# Patient Record
Sex: Female | Born: 1950 | Race: Black or African American | Hispanic: No | Smoking: Former smoker
Health system: Southern US, Community
[De-identification: ages and names within clinical notes are randomized; demographics above are authoritative.]

## PROBLEM LIST (undated history)

## (undated) DIAGNOSIS — N281 Cyst of kidney, acquired: Secondary | ICD-10-CM

## (undated) DIAGNOSIS — F419 Anxiety disorder, unspecified: Secondary | ICD-10-CM

## (undated) DIAGNOSIS — R000 Tachycardia, unspecified: Secondary | ICD-10-CM

## (undated) DIAGNOSIS — E785 Hyperlipidemia, unspecified: Secondary | ICD-10-CM

## (undated) DIAGNOSIS — D649 Anemia, unspecified: Secondary | ICD-10-CM

## (undated) DIAGNOSIS — I10 Essential (primary) hypertension: Secondary | ICD-10-CM

## (undated) DIAGNOSIS — F32A Depression, unspecified: Secondary | ICD-10-CM

## (undated) DIAGNOSIS — I499 Cardiac arrhythmia, unspecified: Secondary | ICD-10-CM

## (undated) DIAGNOSIS — Z720 Tobacco use: Secondary | ICD-10-CM

## (undated) HISTORY — PX: SHOULDER SURGERY: SHX246

## (undated) HISTORY — PX: HERNIA REPAIR: SHX51

## (undated) HISTORY — DX: Hyperlipidemia, unspecified: E78.5

## (undated) HISTORY — DX: Tobacco use: Z72.0

## (undated) HISTORY — PX: ABDOMINAL HYSTERECTOMY: SHX81

## (undated) HISTORY — PX: TONSILLECTOMY: SUR1361

---

## 2001-06-13 ENCOUNTER — Encounter: Payer: Self-pay | Admitting: Internal Medicine

## 2001-06-13 ENCOUNTER — Encounter: Admission: RE | Admit: 2001-06-13 | Discharge: 2001-06-13 | Payer: Self-pay | Admitting: Internal Medicine

## 2003-11-22 ENCOUNTER — Observation Stay (HOSPITAL_COMMUNITY): Admission: RE | Admit: 2003-11-22 | Discharge: 2003-11-22 | Payer: Self-pay | Admitting: *Deleted

## 2008-08-07 ENCOUNTER — Ambulatory Visit: Admission: RE | Admit: 2008-08-07 | Discharge: 2008-08-07 | Payer: Self-pay | Admitting: Gynecologic Oncology

## 2008-08-07 ENCOUNTER — Other Ambulatory Visit: Admission: RE | Admit: 2008-08-07 | Discharge: 2008-08-07 | Payer: Self-pay | Admitting: Gynecology

## 2008-08-08 ENCOUNTER — Encounter: Payer: Self-pay | Admitting: Gynecologic Oncology

## 2008-08-15 ENCOUNTER — Ambulatory Visit (HOSPITAL_COMMUNITY): Admission: RE | Admit: 2008-08-15 | Discharge: 2008-08-15 | Payer: Self-pay | Admitting: Urology

## 2008-08-30 ENCOUNTER — Ambulatory Visit (HOSPITAL_COMMUNITY): Admission: RE | Admit: 2008-08-30 | Discharge: 2008-08-30 | Payer: Self-pay | Admitting: Urology

## 2008-12-13 ENCOUNTER — Emergency Department (HOSPITAL_COMMUNITY): Admission: EM | Admit: 2008-12-13 | Discharge: 2008-12-13 | Payer: Self-pay | Admitting: Emergency Medicine

## 2008-12-27 ENCOUNTER — Ambulatory Visit: Admission: RE | Admit: 2008-12-27 | Discharge: 2008-12-27 | Payer: Self-pay | Admitting: Gynecologic Oncology

## 2009-01-01 ENCOUNTER — Encounter: Payer: Self-pay | Admitting: Gynecology

## 2009-01-01 ENCOUNTER — Inpatient Hospital Stay (HOSPITAL_COMMUNITY): Admission: RE | Admit: 2009-01-01 | Discharge: 2009-01-04 | Payer: Self-pay | Admitting: Obstetrics & Gynecology

## 2009-02-15 ENCOUNTER — Ambulatory Visit: Admission: RE | Admit: 2009-02-15 | Discharge: 2009-02-15 | Payer: Self-pay | Admitting: Gynecology

## 2010-08-10 ENCOUNTER — Encounter: Payer: Self-pay | Admitting: Family Medicine

## 2010-08-10 ENCOUNTER — Encounter: Payer: Self-pay | Admitting: Urology

## 2010-10-27 LAB — URINALYSIS, ROUTINE W REFLEX MICROSCOPIC
Bilirubin Urine: NEGATIVE
Glucose, UA: NEGATIVE mg/dL
Hgb urine dipstick: NEGATIVE
Ketones, ur: NEGATIVE mg/dL
Nitrite: NEGATIVE
Protein, ur: NEGATIVE mg/dL
Specific Gravity, Urine: 1.011 (ref 1.005–1.030)
Urobilinogen, UA: 0.2 mg/dL (ref 0.0–1.0)
pH: 5.5 (ref 5.0–8.0)

## 2010-10-27 LAB — COMPREHENSIVE METABOLIC PANEL
ALT: 14 U/L (ref 0–35)
Alkaline Phosphatase: 57 U/L (ref 39–117)
BUN: 8 mg/dL (ref 6–23)
CO2: 30 mEq/L (ref 19–32)
Chloride: 105 mEq/L (ref 96–112)
Glucose, Bld: 90 mg/dL (ref 70–99)
Potassium: 4.7 mEq/L (ref 3.5–5.1)
Sodium: 140 mEq/L (ref 135–145)
Total Bilirubin: 0.6 mg/dL (ref 0.3–1.2)

## 2010-10-27 LAB — TYPE AND SCREEN: Antibody Screen: NEGATIVE

## 2010-10-27 LAB — CBC
HCT: 38.8 % (ref 36.0–46.0)
HCT: 42 % (ref 36.0–46.0)
Hemoglobin: 12.6 g/dL (ref 12.0–15.0)
Hemoglobin: 13.7 g/dL (ref 12.0–15.0)
MCHC: 32.6 g/dL (ref 30.0–36.0)
MCV: 83.3 fL (ref 78.0–100.0)
Platelets: 206 10*3/uL (ref 150–400)
RBC: 4.66 MIL/uL (ref 3.87–5.11)
RBC: 5.07 MIL/uL (ref 3.87–5.11)
RDW: 13.9 % (ref 11.5–15.5)
WBC: 11.1 10*3/uL — ABNORMAL HIGH (ref 4.0–10.5)
WBC: 7.1 10*3/uL (ref 4.0–10.5)

## 2010-10-27 LAB — BASIC METABOLIC PANEL
Chloride: 107 mEq/L (ref 96–112)
Creatinine, Ser: 0.56 mg/dL (ref 0.4–1.2)
GFR calc Af Amer: >60 mL/min (ref >60)
GFR calc non Af Amer: >60 mL/min (ref >60)

## 2010-10-27 LAB — DIFFERENTIAL
Basophils Absolute: 0 10*3/uL (ref 0.0–0.1)
Basophils Relative: 1 % (ref 0–1)
Eosinophils Absolute: 0.3 10*3/uL (ref 0.0–0.7)
Neutro Abs: 3.5 10*3/uL (ref 1.7–7.7)
Neutrophils Relative %: 50 % (ref 43–77)

## 2010-10-27 LAB — CA 125: CA 125: 35.9 U/mL — ABNORMAL HIGH (ref 0.0–30.2)

## 2010-10-28 LAB — URINE MICROSCOPIC-ADD ON

## 2010-10-28 LAB — COMPREHENSIVE METABOLIC PANEL
Albumin: 4.1 g/dL (ref 3.5–5.2)
Alkaline Phosphatase: 60 U/L (ref 39–117)
BUN: 9 mg/dL (ref 6–23)
Calcium: 9.6 mg/dL (ref 8.4–10.5)
Creatinine, Ser: 0.92 mg/dL (ref 0.4–1.2)
Glucose, Bld: 132 mg/dL — ABNORMAL HIGH (ref 70–99)
Total Protein: 7.3 g/dL (ref 6.0–8.3)

## 2010-10-28 LAB — URINE CULTURE: Colony Count: >=100000

## 2010-10-28 LAB — CBC
HCT: 40.2 % (ref 36.0–46.0)
Hemoglobin: 13.1 g/dL (ref 12.0–15.0)
MCHC: 32.6 g/dL (ref 30.0–36.0)
MCV: 82.3 fL (ref 78.0–100.0)
Platelets: 245 10*3/uL (ref 150–400)
RDW: 13.8 % (ref 11.5–15.5)

## 2010-10-28 LAB — URINALYSIS, ROUTINE W REFLEX MICROSCOPIC
Glucose, UA: NEGATIVE mg/dL
Hgb urine dipstick: NEGATIVE
Protein, ur: NEGATIVE mg/dL
pH: 7.5 (ref 5.0–8.0)

## 2010-10-28 LAB — DIFFERENTIAL
Basophils Relative: 0 % (ref 0–1)
Lymphocytes Relative: 12 % (ref 12–46)
Monocytes Absolute: 0.1 10*3/uL (ref 0.1–1.0)
Monocytes Relative: 1 % — ABNORMAL LOW (ref 3–12)
Neutro Abs: 8.8 10*3/uL — ABNORMAL HIGH (ref 1.7–7.7)
Neutrophils Relative %: 86 % — ABNORMAL HIGH (ref 43–77)

## 2010-10-28 LAB — PROTIME-INR: INR: 1 (ref 0.00–1.49)

## 2010-11-03 LAB — CEA: CEA: 1.6 ng/mL (ref 0.0–5.0)

## 2010-11-03 LAB — CA 125: CA 125: 25.7 U/mL (ref 0.0–30.2)

## 2010-12-02 NOTE — Op Note (Signed)
NAME:  Beth Pineda, Beth Pineda                ACCOUNT NO.:  [id number]   MEDICAL RECORD NO.:  [id number]          PATIENT TYPE:  INP   LOCATION:  1540                         FACILITY:  [facility]   PHYSICIAN:  De Blanch, M.D.DATE OF BIRTH:  1951/04/22   DATE OF PROCEDURE:  01/01/2009  DATE OF DISCHARGE:                               OPERATIVE REPORT   PREOPERATIVE DIAGNOSIS:  Complex pelvic mass, a right renal mass.   POSTOPERATIVE DIAGNOSIS:  Right ovarian fibrothecoma, leiomyomata uteri,  enlarged kidney.   PROCEDURE:  1. Total abdominal hysterectomy.  2. Right salpingo-oophorectomy.   SURGEON:  Emmaline Kluver, MD.   ASSISTANTS:  Antionette Char, MD;  Telford Nab, RN.   ANESTHESIA:  General with orotracheal tube.   ESTIMATED BLOOD LOSS:  300 mL.   SURGICAL FINDINGS:  At the time of exploratory laparotomy, the patient  had a vascular 6-cm mass arising from the right ovary.  There were some  peritubal cysts as well.  The uterus was grossly enlarged to  approximately 14- weeks size with a  pedunculated fibroid from the  uterine fundus.  The left tube and ovary appeared normal.  Exploration  of the upper abdomen revealed a kidney on the right that was  approximately 10 x 15 cm.  The liver, diaphragm, spleen, omentum, small  and large bowel, and appendix were normal.  There was no adenopathy.   PROCEDURE:  The patient brought to the operating room and after  satisfactory attainment of general anesthesia was placed in modified  lithotomy position in the Drexel stirrups.  The patient was examined.   The anterior abdominal wall, perineum, and vagina were prepped with  Betadine, a Foley catheter was inserted, and the patient was draped.  The abdomen was entered through a low midline incision.  Peritoneal  washings were obtained.  The abdomen and pelvis were explored with the  above-noted findings.  A Bookwalter retractor was positioned, and the  small bowel packed out  the pelvis.  The right retroperitoneal space was  opened identifying the external iliac artery, internal iliac artery,  ovarian vessels, and ureter.  The ovarian vessels were skeletonized,  clamped, cut, free-tied, and suture ligated.  The uterine cornu was  cross-clamped with a Kelly clamp, and the fallopian tube and ovarian  ligament were doubly cross-clamped and then divided.  The right tube and  ovary were submitted of frozen section with the above-noted findings.  Attention was turned to the left side of the pelvis where the round  ligament was divided and the retroperitoneal space opened.  The  important anatomy was identified.   The ovarian ligament and fallopian tube were cross-clamped at the level  of the uterine cornu and divided.  The distal pedicle of the ovarian  vessels and tube were free-tied with 2-0 Vicryl and then suture ligated,  thus preserving the left tube and ovary.   The peritoneum along the bladder was incised and the bladder flap  developed.  There were some very large veins in the paracervical area,  which bled.  These were clamped and suture ligated.  The uterine vessels  were clamped, divided, and suture ligated.  A supracervical hysterectomy  was then performed in order to get better exposure of the cervix.  Once  the uterus was removed from the operative field, the cervix was removed  with clamps along the cardinal ligament, paracervical tissues, and then  vaginal angles.  Vaginal angles were incised at the junction between the  cervix and the vagina.  Vaginal angles were transfixed with 0 Vicryl,  the central portion of the vagina closed with interrupted figure-of-  eight sutures of 0 Vicryl.  The pelvis was irrigated and inspected and  found to be hemostatic.   The packs and retractors were removed.  The anterior abdominal wall was  closed in layers, the first being a running mass closure using #1 PDS.  The subcutaneous tissue was irrigated and  hemostasis achieved with  cautery, the skin was closed with skin staples, a dressing was applied,  the patient was awakened from anesthesia and taken to the recovery room  in satisfactory condition.  Sponge, needle, and instrument counts  correct x2.      De Blanch, M.D.  Electronically Signed     DC/MEDQ  D:  01/01/2009  T:  01/01/2009  Job:  [id number]   cc:   Telford Nab, R.N.  [address]  [place], [place] [address]   Lindaann Slough, M.D.  Fax: [phone]   Tamika J. Lazarus Salines, M.D.  Fax: [phone]

## 2010-12-02 NOTE — Consult Note (Signed)
NAME:  Beth Pineda, Beth Pineda                ACCOUNT NO.:  [id number]   MEDICAL RECORD NO.:  [id number]          PATIENT TYPE:  OUT   LOCATION:  GYN                          FACILITY:  [facility]   PHYSICIAN:  Laurette Schimke, MD     DATE OF BIRTH:  March 28, 1951   DATE OF CONSULTATION:  DATE OF DISCHARGE:  12/27/2008                                 CONSULTATION   CHIEF COMPLAINT:  Severe pelvic pain and pelvic mass.   HISTORY OF PRESENT ILLNESS:  This is a 60 year old referred by Dr. Lazarus Salines  to the GYN oncology service on August 07, 2008.  She initially  presented to Dr. Raye Sorrow office with complaints of intermittent  constipation.  A flat plate and upright films were suspicious for a  mass.  A CT of the abdomen and pelvis were obtained, which showed a 2 cm  cystic mass in the left lobe of the liver, multiple calcified and  noncalcified stones in the gallbladder.  A large septated cystic mass in  the mid upper portion of the right kidney, resulting in compression of  the pelvic at the right kidney with mild-to-moderate hydronephrosis, and  an 11 cm right adnexal mass.  At that time, the recommendations made to  Beth Pineda were that perhaps a joint procedure could be performed  laparoscopically, at which time, both the ovary, the kidney, and perhaps  the gallbladder could be attended to.  [name] felt that since she was  asymptomatic, intervention likely was not required, and thought that she  would pursue other diagnostic testing and other consultations.  She  states that she has had a chest x-ray, it is unclear where this was  obtained, which was negative, which per her evaluation, indicated that  she was doing well.  She also said that she had unspecified blood tests  that were normal.  She sought a second opinion from a urologist at [facility] in April.  The report is not available.  Ms. Graff did not  provide [place] with a description of what the recommendation was from that  physician.  What  prompted this visit to the office today is the fact  that she noted right lower quadrant that is constant, varying between 3  and 8, associated with nausea.  She was seen in the emergency room for  this pain on Dec 13, 2008.  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  No adenopathy or ascites was noted.  The left  kidney was noted to be normal.  Gallstones were again appreciated.  The  right adnexa was noted, measuring 7 x 6.7 cm.  On that scan, it was  noted to be inseparable from a more solid-appearing structure in the  adnexa.  For this severe pain, Beth Pineda presents.  She reports low  energy since May, 2010, poor appetite, early satiety, constipation, so  to the point where she has limited herself to a liquid diet, and she  states that her bowel movements are  now less frequent, smaller, and  harder.   PAST MEDICAL HISTORY:  Notable for prior morbid obesity, which she  states resolved with a low carb diet.  Cholelithiasis.   PAST SURGICAL HISTORY:  Cesarean section x2.  Right inguinal hernia  repair for an incarcerated loop of bowel in 2005.   PAST GYN HISTORY:  Gravida 4, para 2.  Menopause at age 58.  Last Pap 1  year ago.  No history of abnormal Pap test screening.  No prior  colonoscopy.  No recent mammogram, despite recommendations at the last  visit.   REVIEW OF SYSTEMS:  A 10-point review of systems was performed, and  notable findings are as presented in the history of present illness.   PHYSICAL EXAMINATION:  Weight 141 pounds.  Blood pressure 122/82.  CHEST:  Clear to auscultation.  LYMPH NODE:  No cervical, supraclavicular, or inguinal adenopathy.  ABDOMEN:  Soft.  There is right lower quadrant tenderness.  There is no  palpable ascites or omental cake.  BACK:  No CVA tenderness.  PELVIC:  Normal external genitalia, Bartholin, urethra, and Skene  glands.  A smooth, fixed  pelvic mass is appreciated in the right lower  quadrant.  There is no nodularity noted in the cul de sac.  RECTAL:  Good tone without any masses or nodularity to the cul de sac.   IMPRESSION:  Pelvic mass:  Patient has a mass that has solid components.  The plan was for simultaneous surgical management of both the right  adnexal mass and the right nephrectomy.  An attempt was made to contact  urologist to identify whether there was a possibility of this procedure  being done in the near time.  That does not appear to be possible.  As  such, we will proceed with management of the adnexal mass.  This will be  done through the cesarean section incision, and [name] has requested  at this time that it be limited to only affected structures, namely the  right ovary.  In the event that malignancy is identified, she is aware  that the plan would be for BSO, hysterectomy, debulking, and staging.  She is aware that the surgical procedure, there will not be the  possibility of managing the right kidney mass, and it does not appear  that she wishes to attend to the right kidney mass in any urgent  fashion.   The risks of the procedure were discussed with the patient and her son,  which include infection, bleeding, damage to surrounding structures,  prolonged hospitalization, and reoperation.  She understands.   She will continue ciprofloxacin, administered in the ER.  In addition, a  prescription for Vicodin was given.  She is aware that Dr. Katheren Shams-  Sharol Given will perform this surgery next Tuesday.      Laurette Schimke, MD  Electronically Signed     WB/MEDQ  D:  12/27/2008  T:  12/28/2008  Job:  [id number]   cc:   Tamika J. Lazarus Salines, M.D.  Fax: [phone]   Lindaann Slough, M.D.  Fax: [phone]   Dorian Pod, ACNP

## 2010-12-02 NOTE — Consult Note (Signed)
NAME:  Beth Pineda, Beth Pineda NO.:  [id number]   MEDICAL RECORD NO.:  [id number]          PATIENT TYPE:  EMS   LOCATION:  ED                           FACILITY:  [facility]   PHYSICIAN:  Ardeth Sportsman, MD     DATE OF BIRTH:  [birth year]   DATE OF CONSULTATION:  12/13/2008  DATE OF DISCHARGE:                                 CONSULTATION   PRIMARY CARE PHYSICIAN:  Dorian Pod, ACNP.   GYNECOLOGIST:  Laurette Schimke, MD.   PRIMARY UROLOGIST:  Lindaann Slough, M.D.   SURGEON:  Ardeth Sportsman, MD.   REQUESTING PHYSICIAN:  Jerelyn Scott, MD. and Jodean Lima, PA-C, [facility].   REASON FOR CONSULTATION:  Nausea, vomiting and right groin/suprapubic  pain.   HISTORY OF PRESENT ILLNESS:  Ms. Crumm is a 60 year old female with  numerous issues.  She has known complex right ovarian cystic mass that  Dr. Laurette Schimke has evaluated and is considering resection along with  probably abdominal hysterectomy and right salpingo-oophorectomy (the  patient wished to preserve her left ovary).  She was also noted to have  a giant right renal mass that seems to be more consistent with a  nephroma.  I believe she has had a consult with Dr. Ezzie Dural of  [facility] for consideration of resection and got a second opinion  at [facility].  She tells me that [facility] saw her last  month and recommended observation only, not a nephrectomy.   She came in the ER after having severe right groin pain that woke her up  at 2 in the morning.  It reminded her of her incident of having an  incarcerated right inguinal hernia that my partner, Dr. Baruch Merl  had to do emergency surgery on back in 2005.  She had a couple episodes  of emesis that very dark brown according to her sons.  She thought they  were coffee grounds and had a funny taste to it, but she did not say it  tasted like frank blood.  She denies a history of any ulcers.  She has  some mild  reflux for which she takes Tums normally.  She has known  gallstones, but has never had any symptoms of biliary colic and claims  she can eat wants without any difficulty.  She normally has daily bowel  movements.  She has one sister with irritable bowel syndrome, but no  other family history of any GI problems.  She had a large bowel movement  a few hours ago that was normal that was without any hematemesis,  hematochezia or melena within it.  She denies any sick contacts or  travel history.  No change in her diet.  No history of diverticulosis.  She has never had a colonoscopy.   She has been given a few doses of narcotics.  She had a CAT scan which  noted the findings above.  The appendix could not be easily seen day,  but the radiologist could see retroperitoneal fat around the area, did  not see any  stranding around there and therefore the suspicion for  appendicitis was low.  However because of severe abdominal pain on the  right lower side, the ER physician's requested a general surgical  evaluation to make sure there is not a surgical etiology for her  problems.   The patient notes she took some charcoal tabs  that she said are sort of  an anti-gas tabs and did not have improvement.  She notes that her pain  is down, but she has had a few narcotic doses.  The most recent dose was  over 2 hours ago, morphine 2 mg.  She does have history of urinary tract  infections, but cannot recall any GI symptoms with this.  She denies any  dysuria, hematuria, pyuria.  No vaginal bleeding or discharge.  No  menorrhagia.   PAST MEDICAL HISTORY:  1. Complex right ovarian cyst.  2. Large right kidney mass replacing most of the right kidney most      likely nephroma versus a multicystic BSO carcinoma.  [facility] felt this is a benign lesion according to      the patient.  3. Hypertension.  4. Incarcerated inguinal hernia status post emergent reduction and      repair  in 2005 by Dr. Colin Benton.  5. Question of gastroesophageal reflux disease.  6. Known gallstones, asymptomatic.  7. Mild biliary and pancreatic duct dilatation, stable for several      months without any change.   PAST SURGICAL HISTORY:  1. She has had C. sections x2.  2. She had an open right inguinal repair with mesh in 2005.   MEDICATIONS:  1. Lisinopril.  2. Hydrochlorothiazide.  3. Aspirin.   ALLERGIES:  MACROBID.   SOCIAL HISTORY:  She is here today with her 2 sons.  She has a boyfriend  who she lives with.  She denies any significant alcohol drinkage,  although she does confess she had a little alcohol last night.  No  tobacco or other drug use.   FAMILY HISTORY:  She has a sister with irritable bowel syndrome, but no  history of any GI problems or  genitourinary problems that she can  recall.  No history of cancers, colon polyps, irritable bowel syndrome  or inflammatory bowel disease.   REVIEW OF SYSTEMS:  Notes per HPI.  GENERAL:  No fevers, chills, sweats.  No change in her weight.  Eyes, HEENT, cardiac, respiratory otherwise  negative.  ABDOMEN:  Noted per HPI.  No dysphagia to solid or liquids.  GU/GYN as noted in the HPI.  Heme, lymph, allergic, dermatologic are  negative.  PSYCHIATRIC:  Negative as well.   PHYSICAL EXAMINATION:  VITAL SIGNS:  Temperature is 98.3, blood pressure  is 158-150/60-90s, pulses are 90, respirations to me were 16.  Initial  pain was 10/10, most recently it was about 03/10 pain, 100% sats on room  air.  GENERAL:  She is well-developed, well-nourished, slightly overweight  female lying in bed tired, but not frankly toxic.  PSYCH:  She seems to have at least average intelligence and pretty good  insight.  No evidence of any dementia, psychosis, paranoia.  HEENT:  Eyes:  Pupils are equal, round, and reactive to light.  Extraocular movements are intact.  Sclerae nonicteric or injected.  She  is normocephalic.  Mucous membranes are dry, but  nasopharynx and  oropharynx are clear.  Dentition seems fair.  NECK:  Supple.  No masses.  Trachea  is midline.  HEART:  Regular rate and rhythm.  No murmurs, clicks or rubs.  CHEST:  Clear to auscultation bilaterally, anteriorly, posteriorly.  There is no pain to rib or sternal compression.  BACK:  No pain on cervical, thoracolumbosacral spine palpation.  No pain  in the costophrenic angles.  ABDOMEN:  Soft, slightly overweight weight, but nondistended.  She has  no umbilical hernia.  She has a low midline incision that is well-healed  with no inguinal hernias.  She is nontender in her upper abdomen or her  left side.  No Murphy's sign.  GENITAL:  Normal external female genitalia.  I detect no inguinal  hernias.  She does have discomfort in her right suprapubic region that  seems kind of close more down in the inguinal region.  It is inferior to  McBurney's point.  She does not really have any guarding was.  She  tolerated cough and bed shake without any difficulty.  RECTAL:  Refused.  EXTREMITIES:  No clubbing, cyanosis or edema.  MUSCULOSKELETAL:  Normal range of motion of the shoulders, elbows,  wrists as well as hips, knees, ankles.  LYMPH:  No head, neck axillary, groin lymphadenopathy.  SKIN:  No spider angiomas.  No telangiectasias.  No other sores or  lesions.   LABORATORY DATA:  White count is normal at 10.2, hemoglobin of 13.1.  She has a slight left shift.  Electrolytes are normal.  LFTs are normal.  Urine shows positive nitrates and large leukocyte esterase with 21-50  white cells.   DIAGNOSTICS:  CT scan I reviewed with my partner, Dr. Lina Sar as  well as with radiology.  She has some mild biliary dilatation,  intrahepatic as well as pancreatic dilatation.  No gallstones.  Compared  to her MRI that I reviewed with radiology in January this is unchanged.  There is no evidence of any cholecystitis.  No pericholecystic fluid or  gallbladder wall thickening.  There  is no discrete pancreatic mass and  some angulation of the head of the pancreas.  This is based on MRI and  on CT.  A small bowel that shows no evidence of any bowel obstruction.  There is no transition point.  Her colon has some stool, but not a large  volume.  Radiology cannot definitely see the appendix.  Dr. Dwain Sarna  and I think we see a possible candidate for a retrocecal appendix that  is very short.  There is a good rim of fat around the cecum and terminal  ileum.  There is no evidence of any stranding there at all.  She has no  inguinal herniations and no incisional herniations.   She has a large right adnexal mass as has been described before that  seems about 9-10 cm in size mainly cystic and actually pushes up close  to the umbilicus and more solid component in the right lower side.  The  top part of the cyst seen on the MRI, but a full MRI of the pelvis has  not been done that we aware of.  She has a large fibroid uterus and  calcifications in it.  Her bladder is actually pushed over so that only  the right suprapubic area is noted.  It is asymmetrical and slightly  pushing the uterus.   ASSESSMENT/PLAN:  A 60 year old female with numerous health issues and  with recent right suprapubic pain, nausea and vomiting of uncertain  etiology.  I do not think that there is a surgical  issue at this time.  There is no evidence of appendicitis by the fact of lack of stranding.  She does not have any peritonitis to my examination and her white count  is normal.   I think more likely the etiology is an urinary tract infection based on  the very positive UTI and the fact the uterus pushes the bladder over to  the right side which may be giving her more right-sided suprapubic pain.  I would treat that first and regroup.  I would consider her following  with Dr. Nelly Rout or GYN to evaluate to see if anything needs to be done  about the right ovarian mass now versus an elective resection.   It is  probably a stable issue, but it would probably be good to get there and  at least talk to them and see if they have any other insights.  Screening colonoscopy since she is over 50.  If she has repeated emesis,  I would get a Gastroccult and if that is positive, consider an EGD to  rule out some other issue, although that would not explain her right  lower quadrant pain.  No evidence of inguinal hernia at this time, so I  do feel further evaluation done.   I discussed the case with Dr. Dwain Sarna.  I discussed the case with Dr.  Jerelyn Scott and Jodean Lima as well with the emergency department.  Discussed with the patient and the son.  At this point, I do not think  there is anything I can offer surgically.  Hopefully treating the UTI  will make her more comfortable.  If she is admitted and there are  concerns, I would be happy to reconsult at this time.      Ardeth Sportsman, MD  Electronically Signed     SCG/MEDQ  D:  12/13/2008  T:  12/13/2008  Job:  [id number]

## 2010-12-02 NOTE — Consult Note (Signed)
NAME:  Beth Pineda, Beth Pineda                ACCOUNT NO.:  [id number]   MEDICAL RECORD NO.:  [id number]          PATIENT TYPE:  OUT   LOCATION:  GYN                          FACILITY:  [facility]   PHYSICIAN:  Laurette Schimke, MD     DATE OF BIRTH:  [birth year]   DATE OF CONSULTATION:  08/07/2008  DATE OF DISCHARGE:                                 CONSULTATION   REFERRING PHYSICIAN:  Dr. Lazarus Salines   CHIEF COMPLAINT:  Pelvic mass.   HISTORY OF PRESENT ILLNESS:  This is a 60 year old who was in her usual  state of good health who presented to Dr. Raye Sorrow office with complaints  of intermittent constipation with no change in her diet.  A flat plate  and upright was suspicious for a mass.  She subsequently had a CT scan  of the abdomen and  pelvis which demonstrated a 2 cm cystic mass in the  left lobe of the liver and multiple calcified and noncalcified stones in  the gallbladder with a 1.6 cm stone in the neck of the gallbladder with  the gallbladder filled with sludge.  Of note, there was a large septated  cystic mass in the upper mid portion of the right kidney resulting in  compression of the pelvis of the right kidney and mild to moderate  hydronephrosis.  There was also an 11 cm right adnexal mass.  The uterus  was enlarged with multiple fibroids and calcifications.  There was no  ascites and no evidence of retroperitoneal adenopathy.  Beth Pineda denies  any unintended weight loss.  No abdominal distention or change in her  bowel habits.   PAST MEDICAL HISTORY:  Notable for prior morbid obesity which resolved  with a low carb diet and cholelithiasis.   PAST SURGICAL HISTORY:  Cesarean section times two and a right inguinal  hernia repair of an incarcerated loop of bowel in 2005.   PAST GYN HISTORY:  Gravida 4, para 2.  Menopause at age 62.  The patient  states that she has a very good sexual drive.  Last Pap was greater than  one year ago.  She denies any history of abnormal Pap test.   SCREENING:   No prior history of colonoscopy.  Last mammogram in 2002.   SOCIAL HISTORY:  Her children are alive and well.  She reports six  cigarettes a day now, down from one pack per day for approximately 40  years.  She drinks approximately two glasses of alcohol per week.  She  denies IV drug abuse.   REVIEW OF SYSTEMS:  A 10 point review of systems negative.   PHYSICAL EXAMINATION:  A well developed female in no acute distress.  Weight 140 pounds.  Blood pressure 122/86, pulse 82, respiratory rate  18.  CHEST:  Clear to auscultation.  LYMPH NODE SURVEY:  No axillary, cervical or inguinal adenopathy.  ABDOMEN:  Soft, nontender.  A right pelvic mass is palpable.  No  ascites.  No palpable omental cake.  BACK:  No CVA tenderness.  PELVIC:  Normal external genitalia, Bartholin's, urethra, and Skene's.  The  pelvic mass is palpated, smooth.  There is no nodularity in the cul  de sac.  RECTAL:  No nodularity within the rectovaginal septum or within the cul  de sac.   IMPRESSION:  Pelvic mass.  This is an 11 cm complex solid and cystic  mass measuring approximately 11 cm in maximal dimension.  A CA 125 and a  CEA have been ordered.  Beth Pineda is very determined in her wish to  maintain her uterus and contralateral ovary if they are entirely normal.  She also strongly desires that the procedure be performed  laparoscopically.  Given the other issues involving this patient's care,  namely the right renal mass and the nephrolithiasis, if it is possible  for these procedures to be done laparoscopically at one sitting, then we  certainly will attempt to perform this.  She is aware that with two  prior cesarean sections, there may be adhesions which preclude this  procedure being adequately performed.  She was advised regarding  recommendation for removal of the contralateral ovary but wishes to  maintain this ovary.  We will contact Dr. Madilyn Hook office to identify  their plan for management of this  renal mass and will schedule the  patient accordingly.  She is aware that if there is evidence of  malignancy on pathology, a hysterectomy, bilateral salpingo-  oophorectomy, and lymph node dissection will be performed and she  concurs with this plan.      Laurette Schimke, MD  Electronically Signed     WB/MEDQ  D:  08/07/2008  T:  08/07/2008  Job:  [id number]   cc:   Telford Nab, R.N.  [address]  [place], [place] [address]   Thomos Lemons. Lazarus Salines, M.D.  Fax: [phone]   Lindaann Slough, M.D.  Fax: [phone]

## 2010-12-05 NOTE — Consult Note (Signed)
NAME:  Beth Pineda, Beth Pineda                ACCOUNT NO.:  [id number]   MEDICAL RECORD NO.:  [id number]          PATIENT TYPE:  OUT   LOCATION:  GYN                          FACILITY:  [facility]   PHYSICIAN:  De Blanch, M.D.DATE OF BIRTH:  August 02, 1950   DATE OF CONSULTATION:  DATE OF DISCHARGE:  02/15/2009                                 CONSULTATION   CHIEF COMPLAINT:  Postoperative follow-up.   INTERVAL HISTORY:  The patient is here today for first postoperative  follow-up after having undergone a total abdominal hysterectomy and  right salpingo-oophorectomy on January 01, 2009.  She had a pelvic mass  which on final pathology showed that a benign ovarian fibroma and serous  cystadenoma.  She also had some uterine fibroids.  She has had an  uncomplicated postoperative course.  She feels that she has returned to  nearly full levels of activity and denies any abdominal or pelvic pain,  has no bleeding.   PHYSICAL EXAM:  VITAL SIGNS:  Weight 127 pounds, blood pressure 120/78.  ABDOMEN:  Soft and nontender.  The midline incision is healing well.  PELVIC:  Bimanual pelvic exam EG, BUS vagina, urethra are normal.  Vaginal cuff is healing well.  No lesions are noted.  Bimanual exam  reveals postoperative induration without any masses, nodularity, or  tenderness.   IMPRESSION:  Good postoperative recovery.  The patient was given the  okay to return to full levels of activity.   Her pathology is reviewed, and she is reassured she does not have any  evidence of malignancy. She will return to the care of her primary  physicians for continuing medical care.      De Blanch, M.D.  Electronically Signed     DC/MEDQ  D:  02/22/2009  T:  02/22/2009  Job:  [id number]   cc:   Telford Nab, R.N.  [address]  [place], [place] [address]   Lindaann Slough, M.D.  Fax: [phone]   Tamika J. Lazarus Salines, M.D.  Fax: [phone]

## 2010-12-05 NOTE — Discharge Summary (Signed)
NAME:  Beth Pineda, Beth Pineda                ACCOUNT NO.:  [id number]   MEDICAL RECORD NO.:  [id number]          PATIENT TYPE:  INP   LOCATION:  1540                         FACILITY:  [facility]   PHYSICIAN:  Roseanna Rainbow, M.D.DATE OF BIRTH:  21-May-1951   DATE OF ADMISSION:  01/01/2009  DATE OF DISCHARGE:  01/04/2009                               DISCHARGE SUMMARY   CHIEF COMPLAINTS:  The patient is a 60 year old with a pelvic mass and  associated pain who presents for operative management.  Please see the  dictated history and physical as per Dr. Laurette Schimke.   HOSPITAL COURSE:  The patient was admitted and underwent a total  abdominal hysterectomy and right salpingo-oophorectomy.  Please see the  dictated operative summary.  On postoperative day #1 a basic metabolic  profile was normal.  A hemoglobin was 12.6.  The patient had a history  of chronic hypertension and had been noncompliant with her medications.  Benicar was started and her blood pressures were controlled on that  regimen.  Her diet was gradually advanced.  She was discharged to home  on postoperative day #3 tolerating a regular diet.   DISCHARGE DIAGNOSES:  1. 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  2. Cervicitis.  3. Leiomyomata.   PROCEDURES:  Total abdominal hysterectomy and right salpingo-  oophorectomy.   CONDITION:  Stable.   DIET:  Regular.   ACTIVITY:  Pelvic rest.  Progressive activity.   MEDICATIONS:  1. Benicar.  2. Percocet 1-2 tablets every 6 hours as needed.  3. Aspirin.   DISPOSITION:  The patient was to follow up in the GYN oncology office  for routine postoperative care.  Followup was also arranged at [facility] for a renal mass.      Roseanna Rainbow, M.D.  Electronically Signed     LAJ/MEDQ  D:  01/07/2009  T:  01/07/2009  Job:  [id number]   cc:   Telford Nab, R.N.  [address]  [place], [place]  [address]   Thomos Lemons. Lazarus Salines, M.D.  Fax: [phone]   Lindaann Slough, M.D.  Fax: [phone]   Dorian Pod, ACNP

## 2010-12-05 NOTE — H&P (Signed)
NAME:  Beth Pineda, Beth Pineda                            ACCOUNT NO.:  [id number]   MEDICAL RECORD NO.:  [id number]                   PATIENT TYPE:  INP   LOCATION:  NA                                   FACILITY:  [facility]   PHYSICIAN:  Vikki Ports, M.D.         DATE OF BIRTH:  29-Oct-1950   DATE OF ADMISSION:  DATE OF DISCHARGE:                                HISTORY & PHYSICAL   ADMISSION DIAGNOSIS:  Incarcerated right inguinal hernia.   HISTORY OF PRESENT ILLNESS:  The patient is a 60 year old white female who  presented to Urgent Care today with worsening right lower quadrant abdominal  pain. She was felt to have a mass and sent for CT scan.  The patient had no  known history of inguinal hernia, but CT scan showed inguinal hernia with  incarcerated small bowel. The patient was then transferred here to [facility] for operative intervention.   PAST MEDICAL HISTORY:  None.   PAST SURGICAL HISTORY:  Significant for Cesarean section times two.   MEDICATIONS:  None.   ALLERGIES:  None.   PHYSICAL EXAMINATION:  VITAL SIGNS: She is afebrile with a blood pressure of  110/60, respiratory rate 16.  GENERAL: She is an age appropriate white female in moderate distress.  HEENT: Benign. Normocephalic and atraumatic. Pupils equal, round, and  reactive to light.  LUNGS: Clear to auscultation and percussion times two.  HEART: Regular rate and rhythm without murmurs, rubs, or gallops.  ABDOMEN: Soft and tender in the right lower quadrant with a palpable right  inguinal hernia.  EXTREMITIES: Normal extremities with no clubbing, cyanosis, or edema.   IMPRESSION:  Incarcerated right inguinal hernia with possibly strangulated  small bowel.   PLAN:  Incarcerated right inguinal hernia repair with possible small bowel  resection.                                               Vikki Ports, M.D.    KRH/MEDQ  D:  11/21/2003  T:  11/21/2003  Job:  [id number]

## 2010-12-05 NOTE — Op Note (Signed)
NAME:  Beth Pineda, Beth Pineda                            ACCOUNT NO.:  [id number]   MEDICAL RECORD NO.:  [id number]                   PATIENT TYPE:  INP   LOCATION:  NA                                   FACILITY:  [facility]   PHYSICIAN:  Vikki Ports, M.D.         DATE OF BIRTH:  09/16/1950   DATE OF PROCEDURE:  11/22/2003  DATE OF DISCHARGE:                                 OPERATIVE REPORT   PREOPERATIVE DIAGNOSIS:  Incarcerated right inguinal hernia.   POSTOPERATIVE DIAGNOSIS:  Incarcerated right inguinal hernia without small  bowel compromise.   OPERATION/PROCEDURE:  Incarcerated right inguinal hernia repair with mesh.   SURGEON:  Vikki Ports, M.D.   ANESTHESIA:  General.   DESCRIPTION OF PROCEDURE:  The patient was taken to the operating room and  placed in the supine position.  After adequate general anesthesia was  induced, the abdomen was prepped and draped in the normal sterile fashion.  Using an oblique incision in the right lower quadrant over the palpable  mass.  I dissected down to the external oblique fascia which was opened  along its fibers.  Hernia sac was identified and opened.  A small amount of  fluid was suctioned out.  Bowel was inspected.  Hernia defect was opened  further medially.  The bowels, which were the terminal ileum and cecum, were  inspected.  There was no vascular compromise.  This reduced back in the  abdomen and the defect was closed by approximating the inguinal ligament and  Cooper's ligament to the transversalis fascia with interrupted #1 Novofils.  A piece of 6 x 6 PTFE mesh was placed over the floor and tacked to  transversalis fascia and the inguinal ligament pubic tubercle using running  2-0 Prolene suture.  External oblique fascia was closed with a running 3-0  Vicryl.  The wound was irrigated and closed with staples.  The patient  tolerated the procedure well and went to the PACU in good condition.                    Vikki Ports, M.D.    KRH/MEDQ  D:  11/21/2003  T:  11/22/2003  Job:  [id number]

## 2013-11-01 ENCOUNTER — Other Ambulatory Visit: Payer: Self-pay | Admitting: Gastroenterology

## 2013-12-06 ENCOUNTER — Emergency Department (INDEPENDENT_AMBULATORY_CARE_PROVIDER_SITE_OTHER)
Admission: EM | Admit: 2013-12-06 | Discharge: 2013-12-06 | Disposition: A | Discharge status: Home / Self Care | Payer: BC Managed Care – PPO | Source: Home / Self Care | Attending: Family Medicine | Admitting: Family Medicine

## 2013-12-06 ENCOUNTER — Other Ambulatory Visit (HOSPITAL_COMMUNITY)
Admission: RE | Admit: 2013-12-06 | Discharge: 2013-12-06 | Disposition: A | Discharge status: Home / Self Care | Payer: BC Managed Care – PPO | Source: Ambulatory Visit | Attending: Family Medicine | Admitting: Family Medicine

## 2013-12-06 ENCOUNTER — Encounter (HOSPITAL_COMMUNITY): Payer: Self-pay | Admitting: Emergency Medicine

## 2013-12-06 DIAGNOSIS — R109 Unspecified abdominal pain: Secondary | ICD-10-CM

## 2013-12-06 DIAGNOSIS — N760 Acute vaginitis: Secondary | ICD-10-CM | POA: Insufficient documentation

## 2013-12-06 HISTORY — DX: Essential (primary) hypertension: I10

## 2013-12-06 LAB — POCT URINALYSIS DIP (DEVICE)
Bilirubin Urine: NEGATIVE
GLUCOSE, UA: NEGATIVE mg/dL
HGB URINE DIPSTICK: NEGATIVE
KETONES UR: NEGATIVE mg/dL
Leukocytes, UA: NEGATIVE
Nitrite: NEGATIVE
Protein, ur: 30 mg/dL — AB
UROBILINOGEN UA: 0.2 mg/dL (ref 0.0–1.0)
pH: 6 (ref 5.0–8.0)

## 2013-12-06 MED ORDER — TRAMADOL HCL 50 MG PO TABS: 50.0000 mg | ORAL_TABLET | Freq: Four times a day (QID) | ORAL | Status: DC | PRN

## 2013-12-06 MED ORDER — DICLOFENAC SODIUM 50 MG PO TBEC: 50.0000 mg | DELAYED_RELEASE_TABLET | Freq: Two times a day (BID) | ORAL | Status: DC | PRN

## 2013-12-06 NOTE — ED Provider Notes (Signed)
Beth Pineda is a 63 y.o. female who presents to Urgent Care today for left-sided pelvic and groin pain for the past 3-4 days. This is associated with occasional gas. No nausea vomiting diarrhea fevers or chills. She has tried ibuprofen which has helped a bit. She denies any vaginal discharge. She feels well otherwise.  Past surgical history significant for hysterectomy and for right hernia repair.  Past Medical History  Diagnosis Date  . Hypertension    History  Substance Use Topics  . Smoking status: Current Every Day Smoker -- 0.50 packs/day    Types: Cigarettes  . Smokeless tobacco: Not on file  . Alcohol Use: Yes   ROS as above Medications: No current facility-administered medications for this encounter.   Current Outpatient Prescriptions  Medication Sig Dispense Refill  . amLODipine (NORVASC) 10 MG tablet Take 10 mg by mouth daily.      Marland Kitchen aspirin 81 MG tablet Take 81 mg by mouth daily.      . traMADol (ULTRAM) 50 MG tablet Take 1 tablet (50 mg total) by mouth every 6 (six) hours as needed.  15 tablet  0    Exam:  BP 141/93  Pulse 96  Temp(Src) 98.3 F (36.8 C) (Oral)  Resp 16  SpO2 100% Gen: Well NAD HEENT: EOMI,  MMM Lungs: Normal work of breathing. CTABL Heart: RRR no MRG Abd: NABS, Soft. , ND tender to palpation left lower quadrant. Pain worse with abdominal wall flexion Exts: Brisk capillary refill, warm and well perfused.  GYN: Normal external genitalia. Vaginal canal with thin white discharge. No cervix cuff is normal-appearing. No adnexal mass or tenderness bilaterally.  Results for orders placed during the hospital encounter of 12/06/13 (from the past 24 hour(s))  POCT URINALYSIS DIP (DEVICE)     Status: Abnormal   Collection Time    12/06/13  3:50 PM      Result Value Ref Range   Glucose, UA NEGATIVE  NEGATIVE mg/dL   Bilirubin Urine NEGATIVE  NEGATIVE   Ketones, ur NEGATIVE  NEGATIVE mg/dL   Specific Gravity, Urine >=1.030  1.005 - 1.030   Hgb urine  dipstick NEGATIVE  NEGATIVE   pH 6.0  5.0 - 8.0   Protein, ur 30 (*) NEGATIVE mg/dL   Urobilinogen, UA 0.2  0.0 - 1.0 mg/dL   Nitrite NEGATIVE  NEGATIVE   Leukocytes, UA NEGATIVE  NEGATIVE   No results found.  Assessment and Plan: 63 y.o. female with abdominal wall muscle strain. Plan to treat with tramadol for pain control. Followup with primary care provider. If symptoms continue would consider ultrasound.  Discussed warning signs or symptoms. Please see discharge instructions. Patient expresses understanding.    Gregor Hams, MD 12/06/13 [phone]

## 2013-12-06 NOTE — Discharge Instructions (Signed)
Thank you for coming in today. Try taking tramadol as needed.  Do not drive after taking tramadol. Try using a heating pad.  Follow up with your doctor soon.  If you get a lot worse go to the ER.    Abdominal Pain, Women Abdominal (stomach, pelvic, or belly) pain can be caused by many things. It is important to tell your doctor:  The location of the pain.  Does it come and go or is it present all the time?  Are there things that start the pain (eating certain foods, exercise)?  Are there other symptoms associated with the pain (fever, nausea, vomiting, diarrhea)? All of this is helpful to know when trying to find the cause of the pain. CAUSES   Stomach: virus or bacteria infection, or ulcer.  Intestine: appendicitis (inflamed appendix), regional ileitis (Crohn's disease), ulcerative colitis (inflamed colon), irritable bowel syndrome, diverticulitis (inflamed diverticulum of the colon), or cancer of the stomach or intestine.  Gallbladder disease or stones in the gallbladder.  Kidney disease, kidney stones, or infection.  Pancreas infection or cancer.  Fibromyalgia (pain disorder).  Diseases of the female organs:  Uterus: fibroid (non-cancerous) tumors or infection.  Fallopian tubes: infection or tubal pregnancy.  Ovary: cysts or tumors.  Pelvic adhesions (scar tissue).  Endometriosis (uterus lining tissue growing in the pelvis and on the pelvic organs).  Pelvic congestion syndrome (female organs filling up with blood just before the menstrual period).  Pain with the menstrual period.  Pain with ovulation (producing an egg).  Pain with an IUD (intrauterine device, birth control) in the uterus.  Cancer of the female organs.  Functional pain (pain not caused by a disease, may improve without treatment).  Psychological pain.  Depression. DIAGNOSIS  Your doctor will decide the seriousness of your pain by doing an examination.  Blood  tests.  X-rays.  Ultrasound.  CT scan (computed tomography, special type of X-ray).  MRI (magnetic resonance imaging).  Cultures, for infection.  Barium enema (dye inserted in the large intestine, to better view it with X-rays).  Colonoscopy (looking in intestine with a lighted tube).  Laparoscopy (minor surgery, looking in abdomen with a lighted tube).  Major abdominal exploratory surgery (looking in abdomen with a large incision). TREATMENT  The treatment will depend on the cause of the pain.   Many cases can be observed and treated at home.  Over-the-counter medicines recommended by your caregiver.  Prescription medicine.  Antibiotics, for infection.  Birth control pills, for painful periods or for ovulation pain.  Hormone treatment, for endometriosis.  Nerve blocking injections.  Physical therapy.  Antidepressants.  Counseling with a psychologist or psychiatrist.  Minor or major surgery. HOME CARE INSTRUCTIONS   Do not take laxatives, unless directed by your caregiver.  Take over-the-counter pain medicine only if ordered by your caregiver. Do not take aspirin because it can cause an upset stomach or bleeding.  Try a clear liquid diet (broth or water) as ordered by your caregiver. Slowly move to a bland diet, as tolerated, if the pain is related to the stomach or intestine.  Have a thermometer and take your temperature several times a day, and record it.  Bed rest and sleep, if it helps the pain.  Avoid sexual intercourse, if it causes pain.  Avoid stressful situations.  Keep your follow-up appointments and tests, as your caregiver orders.  If the pain does not go away with medicine or surgery, you may try:  Acupuncture.  Relaxation exercises (yoga, meditation).  Group therapy.  Counseling. SEEK MEDICAL CARE IF:   You notice certain foods cause stomach pain.  Your home care treatment is not helping your pain.  You need stronger pain  medicine.  You want your IUD removed.  You feel faint or lightheaded.  You develop nausea and vomiting.  You develop a rash.  You are having side effects or an allergy to your medicine. SEEK IMMEDIATE MEDICAL CARE IF:   Your pain does not go away or gets worse.  You have a fever.  Your pain is felt only in portions of the abdomen. The right side could possibly be appendicitis. The left lower portion of the abdomen could be colitis or diverticulitis.  You are passing blood in your stools (bright red or black tarry stools, with or without vomiting).  You have blood in your urine.  You develop chills, with or without a fever.  You pass out. MAKE SURE YOU:   Understand these instructions.  Will watch your condition.  Will get help right away if you are not doing well or get worse. Document Released: 05/03/2007 Document Revised: 09/28/2011 Document Reviewed: 05/23/2009 [facility] Patient Information 2014 [place], [place].

## 2013-12-06 NOTE — ED Notes (Signed)
C/o  Groin pain since Saturday.  States pain is felt with moving certain ways.  Denies any urinary symptoms or injury.   Having mild gas.

## 2014-09-19 ENCOUNTER — Observation Stay (HOSPITAL_COMMUNITY)
Admission: EM | Admit: 2014-09-19 | Discharge: 2014-09-20 | Disposition: A | Discharge status: Home / Self Care | Payer: BLUE CROSS/BLUE SHIELD | Attending: Internal Medicine | Admitting: Internal Medicine

## 2014-09-19 ENCOUNTER — Encounter (HOSPITAL_COMMUNITY): Payer: Self-pay

## 2014-09-19 ENCOUNTER — Observation Stay (HOSPITAL_COMMUNITY): Payer: BLUE CROSS/BLUE SHIELD

## 2014-09-19 DIAGNOSIS — Z79899 Other long term (current) drug therapy: Secondary | ICD-10-CM | POA: Insufficient documentation

## 2014-09-19 DIAGNOSIS — N2889 Other specified disorders of kidney and ureter: Secondary | ICD-10-CM | POA: Insufficient documentation

## 2014-09-19 DIAGNOSIS — N390 Urinary tract infection, site not specified: Secondary | ICD-10-CM

## 2014-09-19 DIAGNOSIS — F17210 Nicotine dependence, cigarettes, uncomplicated: Secondary | ICD-10-CM | POA: Diagnosis not present

## 2014-09-19 DIAGNOSIS — I10 Essential (primary) hypertension: Secondary | ICD-10-CM | POA: Insufficient documentation

## 2014-09-19 DIAGNOSIS — N12 Tubulo-interstitial nephritis, not specified as acute or chronic: Secondary | ICD-10-CM

## 2014-09-19 DIAGNOSIS — A419 Sepsis, unspecified organism: Principal | ICD-10-CM | POA: Insufficient documentation

## 2014-09-19 DIAGNOSIS — N10 Acute tubulo-interstitial nephritis: Secondary | ICD-10-CM | POA: Diagnosis present

## 2014-09-19 HISTORY — DX: Cyst of kidney, acquired: N28.1

## 2014-09-19 LAB — URINE MICROSCOPIC-ADD ON

## 2014-09-19 LAB — URINALYSIS, ROUTINE W REFLEX MICROSCOPIC
BILIRUBIN URINE: NEGATIVE
GLUCOSE, UA: NEGATIVE mg/dL
Ketones, ur: 40 mg/dL — AB
Nitrite: POSITIVE — AB
PH: 5.5 (ref 5.0–8.0)
Protein, ur: 100 mg/dL — AB
Specific Gravity, Urine: 1.021 (ref 1.005–1.030)
Urobilinogen, UA: 1 mg/dL (ref 0.0–1.0)

## 2014-09-19 LAB — COMPREHENSIVE METABOLIC PANEL
ALT: 11 U/L (ref 0–35)
AST: 20 U/L (ref 0–37)
Albumin: 4.2 g/dL (ref 3.5–5.2)
Alkaline Phosphatase: 64 U/L (ref 39–117)
Anion gap: 9 (ref 5–15)
BUN: 9 mg/dL (ref 6–23)
CO2: 26 mmol/L (ref 19–32)
Calcium: 9.6 mg/dL (ref 8.4–10.5)
Chloride: 103 mmol/L (ref 96–112)
Creatinine, Ser: 0.95 mg/dL (ref 0.50–1.10)
GFR calc Af Amer: 72 mL/min — ABNORMAL LOW (ref >90)
GFR calc non Af Amer: 62 mL/min — ABNORMAL LOW (ref >90)
Glucose, Bld: 94 mg/dL (ref 70–99)
POTASSIUM: 4 mmol/L (ref 3.5–5.1)
SODIUM: 138 mmol/L (ref 135–145)
TOTAL PROTEIN: 7.8 g/dL (ref 6.0–8.3)
Total Bilirubin: 0.9 mg/dL (ref 0.3–1.2)

## 2014-09-19 LAB — CBC WITH DIFFERENTIAL/PLATELET
BASOS ABS: 0 10*3/uL (ref 0.0–0.1)
Basophils Relative: 0 % (ref 0–1)
EOS ABS: 0.1 10*3/uL (ref 0.0–0.7)
Eosinophils Relative: 0 % (ref 0–5)
HCT: 41.2 % (ref 36.0–46.0)
Hemoglobin: 13.7 g/dL (ref 12.0–15.0)
Lymphocytes Relative: 23 % (ref 12–46)
Lymphs Abs: 3.1 10*3/uL (ref 0.7–4.0)
MCH: 26.5 pg (ref 26.0–34.0)
MCHC: 33.3 g/dL (ref 30.0–36.0)
MCV: 79.7 fL (ref 78.0–100.0)
Monocytes Absolute: 0.8 10*3/uL (ref 0.1–1.0)
Monocytes Relative: 6 % (ref 3–12)
NEUTROS PCT: 71 % (ref 43–77)
Neutro Abs: 9.4 10*3/uL — ABNORMAL HIGH (ref 1.7–7.7)
PLATELETS: 254 10*3/uL (ref 150–400)
RBC: 5.17 MIL/uL — ABNORMAL HIGH (ref 3.87–5.11)
RDW: 14.8 % (ref 11.5–15.5)
WBC: 13.4 10*3/uL — AB (ref 4.0–10.5)

## 2014-09-19 LAB — I-STAT CG4 LACTIC ACID, ED: Lactic Acid, Venous: 1.71 mmol/L (ref 0.5–2.0)

## 2014-09-19 MED ORDER — ONDANSETRON HCL 4 MG/2ML IJ SOLN: 4.0000 mg | Freq: Three times a day (TID) | INTRAMUSCULAR | Status: DC | PRN

## 2014-09-19 MED ORDER — AMLODIPINE BESYLATE 10 MG PO TABS
10.0000 mg | ORAL_TABLET | Freq: Every day | ORAL | Status: DC
  Administered 2014-09-20 (×2): 10 mg via ORAL
  Filled 2014-09-19 (×2): qty 1

## 2014-09-19 MED ORDER — HEPARIN SODIUM (PORCINE) 5000 UNIT/ML IJ SOLN
5000.0000 [IU] | Freq: Three times a day (TID) | INTRAMUSCULAR | Status: DC
  Administered 2014-09-19 – 2014-09-20 (×2): 5000 [IU] via SUBCUTANEOUS
  Filled 2014-09-19 (×5): qty 1

## 2014-09-19 MED ORDER — SODIUM CHLORIDE 0.9 % IV BOLUS (SEPSIS)
1000.0000 mL | Freq: Once | INTRAVENOUS | Status: AC
  Administered 2014-09-19: 1000 mL via INTRAVENOUS

## 2014-09-19 MED ORDER — SODIUM CHLORIDE 0.9 % IV SOLN
INTRAVENOUS | Status: DC
  Administered 2014-09-19: 22:00:00 via INTRAVENOUS

## 2014-09-19 MED ORDER — DEXTROSE 5 % IV SOLN
2.0000 g | Freq: Once | INTRAVENOUS | Status: AC
  Administered 2014-09-19: 2 g via INTRAVENOUS
  Filled 2014-09-19: qty 2

## 2014-09-19 MED ORDER — ASPIRIN 81 MG PO CHEW
81.0000 mg | CHEWABLE_TABLET | Freq: Every day | ORAL | Status: DC
  Administered 2014-09-19 – 2014-09-20 (×2): 81 mg via ORAL
  Filled 2014-09-19 (×2): qty 1

## 2014-09-19 MED ORDER — MORPHINE SULFATE 4 MG/ML IJ SOLN
4.0000 mg | Freq: Once | INTRAMUSCULAR | Status: AC
  Administered 2014-09-19: 4 mg via INTRAVENOUS
  Filled 2014-09-19: qty 1

## 2014-09-19 MED ORDER — ACETAMINOPHEN 500 MG PO TABS: 500.0000 mg | ORAL_TABLET | Freq: Four times a day (QID) | ORAL | Status: DC | PRN

## 2014-09-19 MED ORDER — DEXTROSE 5 % IV SOLN
1.0000 g | INTRAVENOUS | Status: DC
  Filled 2014-09-19: qty 10

## 2014-09-19 MED ORDER — MORPHINE SULFATE 2 MG/ML IJ SOLN
2.0000 mg | INTRAMUSCULAR | Status: DC | PRN
  Administered 2014-09-20: 2 mg via INTRAVENOUS
  Filled 2014-09-19: qty 1

## 2014-09-19 MED ORDER — ONDANSETRON HCL 4 MG/2ML IJ SOLN: 4.0000 mg | Freq: Four times a day (QID) | INTRAMUSCULAR | Status: DC | PRN

## 2014-09-19 MED ORDER — PHENAZOPYRIDINE HCL 100 MG PO TABS
200.0000 mg | ORAL_TABLET | Freq: Once | ORAL | Status: AC
  Administered 2014-09-19: 200 mg via ORAL
  Filled 2014-09-19: qty 2

## 2014-09-19 MED ORDER — ONDANSETRON HCL 4 MG PO TABS: 4.0000 mg | ORAL_TABLET | Freq: Four times a day (QID) | ORAL | Status: DC | PRN

## 2014-09-19 MED ORDER — ONDANSETRON HCL 4 MG/2ML IJ SOLN
4.0000 mg | Freq: Once | INTRAMUSCULAR | Status: AC
  Administered 2014-09-19: 4 mg via INTRAVENOUS
  Filled 2014-09-19: qty 2

## 2014-09-19 MED ORDER — HYDROMORPHONE HCL 1 MG/ML IJ SOLN
1.0000 mg | INTRAMUSCULAR | Status: AC | PRN
  Administered 2014-09-20 (×2): 1 mg via INTRAVENOUS
  Filled 2014-09-19 (×2): qty 1

## 2014-09-19 NOTE — H&P (Signed)
[facility] History and Physical  OCTAVIE WESTERHOLD EQA:[id number] DOB: 10/15/1950 DOA: 09/19/2014  Referring physician: EDP PCP: No PCP Per Patient   Chief Complaint: Pyelonephritis   HPI: Beth Pineda is a 64 y.o. female sent to ED for evaluation of pyelonephritis by her PCP.  Patient has a severe, 9/10 left flank pain which has been worsening over the last 48 hours.  There is associated dysuria, fever of up to 101.7 at home, chills.  Fever is improved with tylenol at home, her pain really isnt improved much by the tylenol she says.  This is the first time in a long time she has had pyelonephritis she says, she has been having frequent bouts of UTI recently, most recently treated with 10 days of bactrim for a UTI 3 weeks ago.  Review of Systems: Systems reviewed.  As above, otherwise negative  Past Medical History  Diagnosis Date  . Hypertension   . Kidney cysts    Past Surgical History  Procedure Laterality Date  . Hernia repair    . Abdominal hysterectomy    . Cesarean section    . Shoulder surgery     Social History:  reports that she has been smoking Cigarettes.  She has been smoking about 0.50 packs per day. She does not have any smokeless tobacco history on file. She reports that she drinks alcohol. She reports that she does not use illicit drugs.  Allergies  Allergen Reactions  . Ciprofloxacin     Shoulder locked up    No family history on file.   Prior to Admission medications   Medication Sig Start Date End Date Taking? Authorizing Provider  acetaminophen (TYLENOL) 500 MG tablet Take 500 mg by mouth every 6 (six) hours as needed for mild pain, moderate pain or fever.   Yes Historical Provider, MD  amLODipine (NORVASC) 10 MG tablet Take 10 mg by mouth daily.   Yes Historical Provider, MD  aspirin 81 MG tablet Take 81 mg by mouth daily.   Yes Historical Provider, MD  cholecalciferol (VITAMIN D) 1000 UNITS tablet Take 1,000 Units by mouth daily.   Yes Historical  Provider, MD  COENZYME Q-10 PO Take 50 mg by mouth daily.   Yes Historical Provider, MD  traMADol (ULTRAM) 50 MG tablet Take 1 tablet (50 mg total) by mouth every 6 (six) hours as needed. Patient not taking: Reported on 09/19/2014 12/06/13   Gregor Hams, MD   Physical Exam: Filed Vitals:   09/19/14 2000  BP: 141/89  Pulse: 104  Temp:   Resp:     BP 141/89 mmHg  Pulse 104  Temp(Src) 97.9 F (36.6 C)  Resp 18  Wt 65.942 kg (145 lb 6 oz)  SpO2 100%  General Appearance:    Alert, oriented, no distress, appears stated age  Head:    Normocephalic, atraumatic  Eyes:    PERRL, EOMI, sclera non-icteric        Nose:   Nares without drainage or epistaxis. Mucosa, turbinates normal  Throat:   Moist mucous membranes. Oropharynx without erythema or exudate.  Neck:   Supple. No carotid bruits.  No thyromegaly.  No lymphadenopathy.   Back:     Left CVA tenderness, no spinal tenderness  Lungs:     Clear to auscultation bilaterally, without wheezes, rhonchi or rales  Chest wall:    No tenderness to palpitation  Heart:    Regular rate and rhythm without murmurs, gallops, rubs  Abdomen:  Soft, non-tender, nondistended, normal bowel sounds, no organomegaly  Genitalia:    deferred  Rectal:    deferred  Extremities:   No clubbing, cyanosis or edema.  Pulses:   2+ and symmetric all extremities  Skin:   Skin color, texture, turgor normal, no rashes or lesions  Lymph nodes:   Cervical, supraclavicular, and axillary nodes normal  Neurologic:   CNII-XII intact. Normal strength, sensation and reflexes      throughout    Labs on Admission:  Basic Metabolic Panel:  Recent Labs Lab 09/19/14 1734  NA 138  K 4.0  CL 103  CO2 26  GLUCOSE 94  BUN 9  CREATININE 0.95  CALCIUM 9.6   Liver Function Tests:  Recent Labs Lab 09/19/14 1734  AST 20  ALT 11  ALKPHOS 64  BILITOT 0.9  PROT 7.8  ALBUMIN 4.2   No results for input(s): LIPASE, AMYLASE in the last 168 hours. No results for  input(s): AMMONIA in the last 168 hours. CBC:  Recent Labs Lab 09/19/14 1734  WBC 13.4*  NEUTROABS 9.4*  HGB 13.7  HCT 41.2  MCV 79.7  PLT 254   Cardiac Enzymes: No results for input(s): CKTOTAL, CKMB, CKMBINDEX, TROPONINI in the last 168 hours.  BNP (last 3 results) No results for input(s): PROBNP in the last 8760 hours. CBG: No results for input(s): GLUCAP in the last 168 hours.  Radiological Exams on Admission: No results found.  EKG: Independently reviewed.  Assessment/Plan Active Problems:   Sepsis secondary to UTI   Pyelonephritis, acute   1. Mild sepsis, secondary to pyelonephritis of the left kidney - 1. IVF 2. Rocephin 3. Blood and urine cultures pending 4. Tylenol for fever 5. Morphine for pain 6. Trend leukocytosis, repeat CBC in AM 7. Renal ultrasound    Code Status: Full Code  Family Communication: No family in room Disposition Plan: Admit to obs   Time spent: 50 min  Beth Pineda M. [facility] Pager [phone]  If 7AM-7PM, please contact the day team taking care of the patient [email or web address] Password [facility] 09/19/2014, 8:49 PM

## 2014-09-19 NOTE — ED Provider Notes (Signed)
CSN: [id number]     Arrival date & time 09/19/14  1709 History   First MD Initiated Contact with Patient 09/19/14 1845     Chief Complaint  Patient presents with  . Pyelonephritis     (Consider location/radiation/quality/duration/timing/severity/associated sxs/prior Treatment) HPI   Beth Pineda is a 64 y.o. female sent by her primary care physician for further evaluation of pyelonephritis. Patient reports a severe, 9 out of 10 left flank pain worsening over the last 48 hours with associated dysuria and lower abdominal discomfort. She has a fever with MAXIMUM TEMPERATURE 101. 6 yesterday which is alleviated with Tylenol, she has associated nausea with no vomiting. Patient states she was recently treated for a UTI with 10 days of Bactrim and initially improved, this was approximately 3 weeks ago.   PCP Arron  [address]    Past Medical History  Diagnosis Date  . Hypertension   . Kidney cysts    Past Surgical History  Procedure Laterality Date  . Hernia repair    . Abdominal hysterectomy    . Cesarean section    . Shoulder surgery     No family history on file. History  Substance Use Topics  . Smoking status: Current Every Day Smoker -- 0.50 packs/day    Types: Cigarettes  . Smokeless tobacco: Not on file  . Alcohol Use: Yes   OB History    No data available     Review of Systems  10 systems reviewed and found to be negative, except as noted in the HPI.   Allergies  Ciprofloxacin  Home Medications   Prior to Admission medications   Medication Sig Start Date End Date Taking? Authorizing Provider  acetaminophen (TYLENOL) 500 MG tablet Take 500 mg by mouth every 6 (six) hours as needed for mild pain, moderate pain or fever.   Yes Historical Provider, MD  amLODipine (NORVASC) 10 MG tablet Take 10 mg by mouth daily.   Yes Historical Provider, MD  aspirin 81 MG tablet Take 81 mg by mouth daily.   Yes Historical Provider, MD  cholecalciferol (VITAMIN D) 1000  UNITS tablet Take 1,000 Units by mouth daily.   Yes Historical Provider, MD  COENZYME Q-10 PO Take 50 mg by mouth daily.   Yes Historical Provider, MD  traMADol (ULTRAM) 50 MG tablet Take 1 tablet (50 mg total) by mouth every 6 (six) hours as needed. Patient not taking: Reported on 09/19/2014 12/06/13   Gregor Hams, MD   BP 153/83 mmHg  Pulse 113  Temp(Src) 97.9 F (36.6 C)  Resp 18  Wt 145 lb 6 oz (65.942 kg)  SpO2 100% Physical Exam  Constitutional: She is oriented to person, place, and time. She appears well-developed and well-nourished. No distress.  HENT:  Head: Normocephalic and atraumatic.  Mouth/Throat: Oropharynx is clear and moist.  Eyes: Conjunctivae and EOM are normal. Pupils are equal, round, and reactive to light.  Neck: Normal range of motion. Neck supple.  Cardiovascular: Regular rhythm and intact distal pulses.   Mild tachycardia  Pulmonary/Chest: Effort normal and breath sounds normal. No stridor. No respiratory distress. She has no wheezes. She has no rales. She exhibits no tenderness.  Abdominal: Soft. Bowel sounds are normal. She exhibits no distension and no mass. There is no tenderness. There is no rebound and no guarding.  Musculoskeletal: Normal range of motion. She exhibits no edema or tenderness.  Neurological: She is alert and oriented to person, place, and time.  Psychiatric: She  has a normal mood and affect.  Nursing note and vitals reviewed.   ED Course  Procedures (including critical care time) Labs Review Labs Reviewed  URINALYSIS, ROUTINE W REFLEX MICROSCOPIC - Abnormal; Notable for the following:    Color, Urine AMBER (*)    APPearance TURBID (*)    Hgb urine dipstick LARGE (*)    Ketones, ur 40 (*)    Protein, ur 100 (*)    Nitrite POSITIVE (*)    Leukocytes, UA LARGE (*)    All other components within normal limits  CBC WITH DIFFERENTIAL/PLATELET - Abnormal; Notable for the following:    WBC 13.4 (*)    RBC 5.17 (*)    Neutro Abs 9.4 (*)     All other components within normal limits  COMPREHENSIVE METABOLIC PANEL - Abnormal; Notable for the following:    GFR calc non Af Amer 62 (*)    GFR calc Af Amer 72 (*)    All other components within normal limits  URINE MICROSCOPIC-ADD ON - Abnormal; Notable for the following:    Squamous Epithelial / LPF FEW (*)    Bacteria, UA MANY (*)    All other components within normal limits  URINE CULTURE  I-STAT CG4 LACTIC ACID, ED    Imaging Review No results found.   EKG Interpretation None      MDM   Final diagnoses:  Sepsis, due to unspecified organism  Pyelonephritis    Filed Vitals:   09/19/14 1716 09/19/14 1930 09/19/14 1945 09/19/14 2000  BP: 153/83 139/89 146/89 141/89  Pulse: 113 93 93 104  Temp: 97.9 F (36.6 C)     Resp: 18     Weight: 145 lb 6 oz (65.942 kg)     SpO2: 100% 99% 100% 100%    Medications  sodium chloride 0.9 % bolus 1,000 mL (1,000 mLs Intravenous New Bag/Given 09/19/14 1913)  cefTRIAXone (ROCEPHIN) 2 g in dextrose 5 % 50 mL IVPB (0 g Intravenous Stopped 09/19/14 2008)  morphine 4 MG/ML injection 4 mg (4 mg Intravenous Given 09/19/14 2004)  ondansetron (ZOFRAN) injection 4 mg (4 mg Intravenous Given 09/19/14 2004)  phenazopyridine (PYRIDIUM) tablet 200 mg (200 mg Oral Given 09/19/14 2003)    Beth Pineda is a pleasant 64 y.o. female presenting with pyelonephritis, tachycardic and reporting a fever of 101.6 yesterday. Patient was treated with Bactrim 3 weeks ago and worsened. Will obtain blood cultures, urine cultures, give 2 g of Rocephin and Pyridium. Patient is specifically requesting admission. I've advised the patient that she may not meet admission criteria however technically she is septic with a leukocytosis of 13.4. Lactic acid is normal. Pt will be admitted to Dr. Alcario Drought.  Monico Blitz, PA-C 09/19/14 2040  Veryl Speak, MD 09/20/14 [phone]

## 2014-09-19 NOTE — ED Notes (Addendum)
Pt. Reports left flank pain with burning and pain with urination. Also reports lower abdominal/pelvic pain and fevers. Sent from PCP for work up of pyelo.

## 2014-09-19 NOTE — ED Notes (Signed)
Attempted to call report to floor, nurse unavailable

## 2014-09-20 DIAGNOSIS — N2889 Other specified disorders of kidney and ureter: Secondary | ICD-10-CM

## 2014-09-20 LAB — BASIC METABOLIC PANEL
Anion gap: 6 (ref 5–15)
BUN: 11 mg/dL (ref 6–23)
CHLORIDE: 104 mmol/L (ref 96–112)
CO2: 24 mmol/L (ref 19–32)
Calcium: 8.5 mg/dL (ref 8.4–10.5)
Creatinine, Ser: 0.76 mg/dL (ref 0.50–1.10)
GFR calc Af Amer: >90 mL/min (ref >90)
GFR, EST NON AFRICAN AMERICAN: 87 mL/min — AB (ref >90)
GLUCOSE: 78 mg/dL (ref 70–99)
Potassium: 4.1 mmol/L (ref 3.5–5.1)
SODIUM: 134 mmol/L — AB (ref 135–145)

## 2014-09-20 LAB — CBC
HEMATOCRIT: 37.6 % (ref 36.0–46.0)
HEMOGLOBIN: 12.1 g/dL (ref 12.0–15.0)
MCH: 26.2 pg (ref 26.0–34.0)
MCHC: 32.2 g/dL (ref 30.0–36.0)
MCV: 81.6 fL (ref 78.0–100.0)
PLATELETS: 231 10*3/uL (ref 150–400)
RBC: 4.61 MIL/uL (ref 3.87–5.11)
RDW: 14.9 % (ref 11.5–15.5)
WBC: 11.1 10*3/uL — ABNORMAL HIGH (ref 4.0–10.5)

## 2014-09-20 MED ORDER — OXYCODONE-ACETAMINOPHEN 5-325 MG PO TABS: 1.0000 | ORAL_TABLET | ORAL | Status: DC | PRN

## 2014-09-20 MED ORDER — CEFUROXIME AXETIL 250 MG PO TABS
250.0000 mg | ORAL_TABLET | Freq: Two times a day (BID) | ORAL | Status: DC
Start: 2014-09-20 — End: 2015-05-12

## 2014-09-20 NOTE — Discharge Summary (Signed)
Physician Discharge Summary  Beth Pineda:[id number] DOB: 02/15/1951 DOA: 09/19/2014  PCP: No PCP Per Patient  Admit date: 09/19/2014 Discharge date: 09/20/2014  Time spent: 25 minutes  Recommendations for Outpatient Follow-up:  1. Dr Remi Deter office to arrange follow up with pt after discharge 2. Pt to follow up with PCP in 1-2 weeks  Discharge Diagnoses:  Active Problems:   Sepsis secondary to UTI   Pyelonephritis, acute   Discharge Condition: Improved  Diet recommendation: Regular  Filed Weights   09/19/14 1716 09/19/14 2204  Weight: 65.942 kg (145 lb 6 oz) 63.7 kg (140 lb 6.9 oz)    History of present illness:  Please see admit h and p from 3/2 for details. Briefly, pt presents from PCP office with pyelonephritis with L flank pain. The patient was admitted for further workup.  Hospital Course:  The patient was admitted to the floor. Leukocytosis improved with rocephin overnight. The patient remained afebrile. Renal US was obtained which demonstrated slightly enlarged R renal mass from prior study in 2010. 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 Pt underwent gyn surgery and was referred to [facility] for her renal mass, seen by Dr. Rutherford Limerick on 10/2008. At that visit, patient was recommended to have radical nephrectomy however patient declined because, "I didn't feel I needed it, especially already having surgery." Patient was instructed to return for follow up, but was a "no show" on her 01/2009 visit with Urology at [facility]. Patient has since been lost to follow up for her R renal mass. On the day of discharge, the patient agrees to now see Urology but requests a local Urologist. The office of Dr. Kellie Simmering was called who stated that the patient will be contacted to schedule an appointment after discharge. The patient otherwise remained medically stable and she will be discharged  home with ceftin to complete her course of abx.   Discharge Exam: Filed Vitals:   09/19/14 2154 09/19/14 2204 09/20/14 0514 09/20/14 1419  BP: 148/84  129/70 111/59  Pulse:    85  Temp: 98.4 F (36.9 C)  99 F (37.2 C)   TempSrc: Oral  Oral   Resp: 20  20 18   Height:  5\' 6"  (1.676 m)    Weight:  63.7 kg (140 lb 6.9 oz)    SpO2: 98%  97% 95%    General: Awake, in nad Cardiovascular: regular, s1, s2 Respiratory: normal resp effort, no wheezing  Discharge Instructions     Medication List    TAKE these medications        acetaminophen 500 MG tablet  Commonly known as:  TYLENOL  Take 500 mg by mouth every 6 (six) hours as needed for mild pain, moderate pain or fever.     amLODipine 10 MG tablet  Commonly known as:  NORVASC  Take 10 mg by mouth daily.     aspirin 81 MG tablet  Take 81 mg by mouth daily.     cefUROXime 250 MG tablet  Commonly known as:  CEFTIN  Take 1 tablet (250 mg total) by mouth 2 (two) times daily with a meal.     cholecalciferol 1000 UNITS tablet  Commonly known as:  VITAMIN D  Take 1,000 Units by mouth daily.     COENZYME Q-10 PO  Take 50 mg by mouth daily.     oxyCODONE-acetaminophen 5-325 MG per tablet  Commonly known as:  ROXICET  Take 1 tablet by mouth every 4 (four) hours as needed for severe pain.     traMADol 50 MG tablet  Commonly known as:  ULTRAM  Take 1 tablet (50 mg total) by mouth every 6 (six) hours as needed.       Allergies  Allergen Reactions  . Ciprofloxacin     Shoulder locked up       Follow-up Information    Follow up with Follow up with PCP in 1-2 weeks. Schedule an appointment as soon as possible for a visit in 1 week.      Follow up with Arvil Persons, MD.   Specialty:  Urology   Contact information:   [place] [address] [phone]       Follow up with Arvil Persons, MD.   Specialty:  Urology   Why:  Doctors office will call patient week of March 07th-March 11th AFTER discharge.  NS Notified nurse, Nurse notified Dr. Wyline Copas, Dr aprroved @ 09/20/14 @ 13:50pm.    Contact information:   [place] [address] [phone]        The results of significant diagnostics from this hospitalization (including imaging, microbiology, ancillary and laboratory) are listed below for reference.    Significant Diagnostic Studies: US Renal  09/19/2014   CLINICAL DATA:  Left flank pain for 1 day.  Initial encounter.  EXAM: RENAL/URINARY TRACT ULTRASOUND COMPLETE  COMPARISON:  None.  FINDINGS: Right Kidney:  Length: 11.6 cm. Echogenicity within normal limits. No evidence of hydronephrosis. A very large partially solid multi-cystic mass is noted arising at the interpole region of the right kidney, measuring approximately 12.3 x 11.4 x 6.9 cm. This has gradually increased in size from 2010, at which time it measured 11.9 x 9.3 x 6.3 cm.  Left Kidney:  Length: 10.6 cm. Echogenicity within normal limits. No mass or hydronephrosis visualized.  Bladder:  Appears normal for degree of bladder distention.  IMPRESSION: 1. No evidence of hydronephrosis. No focal abnormality seen to suggest pyelonephritis, though evaluation for pyelonephritis is limited on ultrasound. The patient's urinalysis results are compatible with urinary tract infection. 2. Very large partially solid multi-cystic mass again noted arising at the interpole region of the right kidney measuring 12.3 x 1.4 x 6.9 cm. This has gradually increased in size from 2010. 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 Surgical consultation is suggested, for resection, as deemed clinically appropriate.   Electronically Signed   By: Garald Balding M.D.   On: 09/19/2014 21:53    Microbiology: No results found for this or any previous visit (from the past 240 hour(s)).   Labs: Basic Metabolic Panel:  Recent Labs Lab 09/19/14 1734 09/20/14 0500  NA 138  134*  K 4.0 4.1  CL 103 104  CO2 26 24  GLUCOSE 94 78  BUN 9 11  CREATININE 0.95 0.76  CALCIUM 9.6 8.5   Liver Function Tests:  Recent Labs Lab 09/19/14 1734  AST 20  ALT 11  ALKPHOS 64  BILITOT 0.9  PROT 7.8  ALBUMIN 4.2   No results for input(s): LIPASE, AMYLASE in the last 168 hours. No results for input(s): AMMONIA in the last 168 hours. CBC:  Recent Labs Lab 09/19/14 1734 09/20/14 0500  WBC 13.4* 11.1*  NEUTROABS 9.4*  --   HGB 13.7 12.1  HCT 41.2 37.6  MCV 79.7  81.6  PLT 254 231   Cardiac Enzymes: No results for input(s): CKTOTAL, CKMB, CKMBINDEX, TROPONINI in the last 168 hours. BNP: BNP (last 3 results) No results for input(s): BNP in the last 8760 hours.  ProBNP (last 3 results) No results for input(s): PROBNP in the last 8760 hours.  CBG: No results for input(s): GLUCAP in the last 168 hours.   Signed:  Amanda Steuart, Orpah Melter  [facility] 09/20/2014, 6:23 PM

## 2014-09-20 NOTE — Progress Notes (Signed)
UR completed 

## 2014-09-20 NOTE — Progress Notes (Signed)
Reviewed discharge information with pt and prescriptions given.  PIV removed.  Pt taken to discharge location via wheelchair.

## 2014-09-21 LAB — URINE CULTURE: Colony Count: >=100000

## 2014-09-26 LAB — CULTURE, BLOOD (ROUTINE X 2)
CULTURE: NO GROWTH
Culture: NO GROWTH

## 2015-05-12 ENCOUNTER — Observation Stay (HOSPITAL_COMMUNITY)
Admission: EM | Admit: 2015-05-12 | Discharge: 2015-05-14 | Disposition: A | Discharge status: Home / Self Care | Payer: BLUE CROSS/BLUE SHIELD | Attending: Family Medicine | Admitting: Family Medicine

## 2015-05-12 ENCOUNTER — Other Ambulatory Visit: Payer: Self-pay

## 2015-05-12 ENCOUNTER — Encounter (HOSPITAL_COMMUNITY): Payer: Self-pay

## 2015-05-12 ENCOUNTER — Emergency Department (INDEPENDENT_AMBULATORY_CARE_PROVIDER_SITE_OTHER)
Admission: EM | Admit: 2015-05-12 | Discharge: 2015-05-12 | Disposition: A | Discharge status: Nursing Home (Includes ICF) | Payer: BLUE CROSS/BLUE SHIELD | Source: Home / Self Care

## 2015-05-12 ENCOUNTER — Emergency Department (HOSPITAL_COMMUNITY): Payer: BLUE CROSS/BLUE SHIELD

## 2015-05-12 DIAGNOSIS — Z720 Tobacco use: Secondary | ICD-10-CM | POA: Diagnosis not present

## 2015-05-12 DIAGNOSIS — R42 Dizziness and giddiness: Secondary | ICD-10-CM | POA: Diagnosis not present

## 2015-05-12 DIAGNOSIS — R5383 Other fatigue: Secondary | ICD-10-CM | POA: Insufficient documentation

## 2015-05-12 DIAGNOSIS — Z7982 Long term (current) use of aspirin: Secondary | ICD-10-CM | POA: Insufficient documentation

## 2015-05-12 DIAGNOSIS — R9431 Abnormal electrocardiogram [ECG] [EKG]: Secondary | ICD-10-CM

## 2015-05-12 DIAGNOSIS — I10 Essential (primary) hypertension: Secondary | ICD-10-CM | POA: Diagnosis not present

## 2015-05-12 DIAGNOSIS — I493 Ventricular premature depolarization: Secondary | ICD-10-CM | POA: Diagnosis present

## 2015-05-12 DIAGNOSIS — Z792 Long term (current) use of antibiotics: Secondary | ICD-10-CM | POA: Insufficient documentation

## 2015-05-12 DIAGNOSIS — R002 Palpitations: Secondary | ICD-10-CM | POA: Insufficient documentation

## 2015-05-12 DIAGNOSIS — R079 Chest pain, unspecified: Secondary | ICD-10-CM

## 2015-05-12 DIAGNOSIS — R0602 Shortness of breath: Secondary | ICD-10-CM | POA: Diagnosis not present

## 2015-05-12 DIAGNOSIS — R071 Chest pain on breathing: Secondary | ICD-10-CM | POA: Insufficient documentation

## 2015-05-12 DIAGNOSIS — Z87448 Personal history of other diseases of urinary system: Secondary | ICD-10-CM | POA: Insufficient documentation

## 2015-05-12 DIAGNOSIS — Z79899 Other long term (current) drug therapy: Secondary | ICD-10-CM | POA: Insufficient documentation

## 2015-05-12 LAB — CBC
HCT: 42.1 % (ref 36.0–46.0)
HEMOGLOBIN: 14 g/dL (ref 12.0–15.0)
MCH: 26.6 pg (ref 26.0–34.0)
MCHC: 33.3 g/dL (ref 30.0–36.0)
MCV: 79.9 fL (ref 78.0–100.0)
Platelets: 241 10*3/uL (ref 150–400)
RBC: 5.27 MIL/uL — AB (ref 3.87–5.11)
RDW: 14.7 % (ref 11.5–15.5)
WBC: 8.1 10*3/uL (ref 4.0–10.5)

## 2015-05-12 LAB — BASIC METABOLIC PANEL
ANION GAP: 8 (ref 5–15)
BUN: 13 mg/dL (ref 6–20)
CHLORIDE: 105 mmol/L (ref 101–111)
CO2: 23 mmol/L (ref 22–32)
CREATININE: 0.8 mg/dL (ref 0.44–1.00)
Calcium: 9.1 mg/dL (ref 8.9–10.3)
GFR calc non Af Amer: >60 mL/min (ref >60)
Glucose, Bld: 83 mg/dL (ref 65–99)
Potassium: 4.1 mmol/L (ref 3.5–5.1)
Sodium: 136 mmol/L (ref 135–145)

## 2015-05-12 LAB — LIPID PANEL
Cholesterol: 205 mg/dL — ABNORMAL HIGH (ref 0–200)
HDL: 47 mg/dL (ref >40)
LDL Cholesterol: 145 mg/dL — ABNORMAL HIGH (ref 0–99)
TRIGLYCERIDES: 64 mg/dL (ref <150)
Total CHOL/HDL Ratio: 4.4 RATIO
VLDL: 13 mg/dL (ref 0–40)

## 2015-05-12 LAB — TROPONIN I: Troponin I: <0.03 ng/mL (ref <0.031)

## 2015-05-12 LAB — POCT URINALYSIS DIP (DEVICE)
BILIRUBIN URINE: NEGATIVE
Glucose, UA: NEGATIVE mg/dL
Ketones, ur: NEGATIVE mg/dL
Nitrite: POSITIVE — AB
PH: 5.5 (ref 5.0–8.0)
PROTEIN: NEGATIVE mg/dL
Specific Gravity, Urine: <=1.005 (ref 1.005–1.030)
Urobilinogen, UA: 0.2 mg/dL (ref 0.0–1.0)

## 2015-05-12 LAB — I-STAT TROPONIN, ED: TROPONIN I, POC: 0 ng/mL (ref 0.00–0.08)

## 2015-05-12 LAB — PROTIME-INR
INR: 1 (ref 0.00–1.49)
PROTHROMBIN TIME: 13.4 s (ref 11.6–15.2)

## 2015-05-12 LAB — TSH: TSH: 1.879 u[IU]/mL (ref 0.350–4.500)

## 2015-05-12 LAB — APTT: aPTT: 37 seconds (ref 24–37)

## 2015-05-12 MED ORDER — NITROGLYCERIN 2 % TD OINT: 1.0000 [in_us] | TOPICAL_OINTMENT | Freq: Once | TRANSDERMAL | Status: DC

## 2015-05-12 MED ORDER — AMLODIPINE BESYLATE 10 MG PO TABS
10.0000 mg | ORAL_TABLET | Freq: Every day | ORAL | Status: DC
Start: 2015-05-12 — End: 2015-05-14
  Administered 2015-05-12 – 2015-05-13 (×2): 10 mg via ORAL
  Filled 2015-05-12 (×2): qty 1

## 2015-05-12 MED ORDER — ASPIRIN EC 81 MG PO TBEC
81.0000 mg | DELAYED_RELEASE_TABLET | Freq: Every day | ORAL | Status: DC
  Administered 2015-05-13 – 2015-05-14 (×2): 81 mg via ORAL
  Filled 2015-05-12 (×2): qty 1

## 2015-05-12 MED ORDER — ACETAMINOPHEN 325 MG PO TABS
650.0000 mg | ORAL_TABLET | ORAL | Status: DC | PRN
  Administered 2015-05-13: 650 mg via ORAL
  Filled 2015-05-12: qty 2

## 2015-05-12 MED ORDER — ONDANSETRON HCL 4 MG/2ML IJ SOLN: 4.0000 mg | Freq: Four times a day (QID) | INTRAMUSCULAR | Status: DC | PRN

## 2015-05-12 MED ORDER — ASPIRIN 81 MG PO CHEW
CHEWABLE_TABLET | ORAL | Status: AC
  Filled 2015-05-12: qty 3

## 2015-05-12 MED ORDER — NITROGLYCERIN 0.4 MG SL SUBL: 0.4000 mg | SUBLINGUAL_TABLET | SUBLINGUAL | Status: DC | PRN

## 2015-05-12 MED ORDER — ASPIRIN 81 MG PO CHEW
324.0000 mg | CHEWABLE_TABLET | Freq: Once | ORAL | Status: AC
  Administered 2015-05-12: 324 mg via ORAL

## 2015-05-12 MED ORDER — SODIUM CHLORIDE 0.9 % IV SOLN: 1000.0000 mL | INTRAVENOUS | Status: DC

## 2015-05-12 MED ORDER — ENOXAPARIN SODIUM 40 MG/0.4ML ~~LOC~~ SOLN
40.0000 mg | SUBCUTANEOUS | Status: DC
  Administered 2015-05-12: 40 mg via SUBCUTANEOUS
  Filled 2015-05-12 (×2): qty 0.4

## 2015-05-12 MED ORDER — DEXTROSE 5 % IV SOLN
1.0000 g | INTRAVENOUS | Status: DC
  Administered 2015-05-12: 1 g via INTRAVENOUS
  Filled 2015-05-12 (×2): qty 10

## 2015-05-12 NOTE — ED Notes (Addendum)
To room via [facility] from urgent care.  Pt c/o intermittant chest pressure, left jaw and neck pain and shortness of breath.  EKG at urgent care showed ST with Mobitz II.  No c/o pain or shortness of breath at this time.  Pt was given ASA 324 mg at urgent care.

## 2015-05-12 NOTE — ED Notes (Signed)
[facility] called and truck is on the way

## 2015-05-12 NOTE — ED Notes (Signed)
Chest pain intermittent since Thursday, reports palpitations and feeling tired.  Chest pain is sharp.  Movement does not change pain in chest.  Intermittent sob, not in association with chest pain.  Left chest.

## 2015-05-12 NOTE — ED Provider Notes (Signed)
CSN: [id number]     Arrival date & time 05/12/15  1407 History   None    No chief complaint on file. CC: chest pain  (Consider location/radiation/quality/duration/timing/severity/associated sxs/prior Treatment) HPI  She is a 63 year old woman here for evaluation of chest pain. She has a history of well-controlled hypertension. She is a current every day smoker. She states she started having intermittent chest pains 3 days ago. They occur randomly. They're located in the left anterior chest. She states they are a sharp piercing pain. They last seconds to minutes. They are not associated with exertion. She will often get pain across her upper back with the pains. Sometimes, she will get an aching sensation in her neck and jaw with the pains. She also reports associated shortness of breath, palpitations, and dizziness. She also reports generalized fatigue over the last several days. She does have a positive family history for cardiac disease as her father died of a heart attack in his 51s.  Currently, she denies any chest pain, but does report feeling short of breath and lightheaded. She also reports a history of recurrent urinary tract infections. She states she feels like she is getting another one.  Past Medical History  Diagnosis Date  . Hypertension   . Kidney cysts    Past Surgical History  Procedure Laterality Date  . Hernia repair    . Abdominal hysterectomy    . Cesarean section    . Shoulder surgery     No family history on file. Social History  Substance Use Topics  . Smoking status: Current Every Day Smoker -- 0.50 packs/day    Types: Cigarettes  . Smokeless tobacco: Not on file  . Alcohol Use: Yes   OB History    No data available     Review of Systems As in history of present illness Allergies  Ciprofloxacin  Home Medications   Prior to Admission medications   Medication Sig Start Date End Date Taking? Authorizing Provider  acetaminophen (TYLENOL) 500 MG  tablet Take 500 mg by mouth every 6 (six) hours as needed for mild pain, moderate pain or fever.    Historical Provider, MD  amLODipine (NORVASC) 10 MG tablet Take 10 mg by mouth daily.    Historical Provider, MD  aspirin 81 MG tablet Take 81 mg by mouth daily.    Historical Provider, MD  cefUROXime (CEFTIN) 250 MG tablet Take 1 tablet (250 mg total) by mouth 2 (two) times daily with a meal. 09/20/14   Donne Hazel, MD  cholecalciferol (VITAMIN D) 1000 UNITS tablet Take 1,000 Units by mouth daily.    Historical Provider, MD  COENZYME Q-10 PO Take 50 mg by mouth daily.    Historical Provider, MD  oxyCODONE-acetaminophen (ROXICET) 5-325 MG per tablet Take 1 tablet by mouth every 4 (four) hours as needed for severe pain. 09/20/14   Donne Hazel, MD  traMADol (ULTRAM) 50 MG tablet Take 1 tablet (50 mg total) by mouth every 6 (six) hours as needed. Patient not taking: Reported on 09/19/2014 12/06/13   Gregor Hams, MD   Meds Ordered and Administered this Visit   Medications  aspirin chewable tablet 324 mg (324 mg Oral Given 05/12/15 1547)    BP 145/99 mmHg  Pulse 90  Temp(Src) 98.1 F (36.7 C) (Oral)  Resp 16  SpO2 100% No data found.   Physical Exam  Constitutional: She is oriented to person, place, and time. She appears well-developed and well-nourished. She appears distressed (  appears fatigued).  Neck: Neck supple.  Cardiovascular: Normal rate and normal heart sounds.   No murmur heard. Irregular rhythm  Pulmonary/Chest: Effort normal and breath sounds normal. No respiratory distress. She has no wheezes. She has no rales. She exhibits no tenderness.  Lymphadenopathy:    She has no cervical adenopathy.  Neurological: She is alert and oriented to person, place, and time.    ED Course  Procedures (including critical care time) ED ECG REPORT   Date: 05/12/2015  Rate: 89  Rhythm: normal sinus rhythm and premature ventricular contractions (PVC)  QRS Axis: normal  Intervals: normal   ST/T Wave abnormalities: nonspecific T wave changes - inversions in V1-V3  Conduction Disutrbances:none  Narrative Interpretation: NSR with frequent PVCs; poor R-wave progression; T-wave inversions in V1-V3  Old EKG Reviewed: none available  I have personally reviewed the EKG tracing and disagree with the computerized printout as noted.   Labs Review Labs Reviewed  POCT URINALYSIS DIP (DEVICE) - Abnormal; Notable for the following:    Hgb urine dipstick SMALL (*)    Nitrite POSITIVE (*)    Leukocytes, UA LARGE (*)    All other components within normal limits  URINE CULTURE    Imaging Review No results found.     MDM   1. Chest pain, unspecified chest pain type   2. Shortness of breath   3. Other fatigue   4. Abnormal EKG    History and exam is concerning for possible cardiac etiology of pain. EKG is abnormal with frequent PVCs and T-wave inversions. I have no prior to compare to. She has are taken 81 mg of aspirin today, we will give her an additional 3 81 mg tablets to bring her up to a full dose aspirin.  Order in [place] is incorrect. Patient received 243 mg of aspirin. Patient placed on cardiac monitor, IV started, placed on oxygen. Transfer to [facility] via EMS for additional workup and management.    Melony Overly, MD 05/12/15 [phone]

## 2015-05-12 NOTE — ED Notes (Signed)
o2 at 2 l [place]

## 2015-05-12 NOTE — H&P (Signed)
[facility] Admission History and Physical Service Pager: [phone]  Patient name: Beth Pineda Medical record number: [id number] Date of birth: January 02, 1951 Age: 64 y.o. Gender: female  Primary Care Provider: No PCP Per Patient Consultants: none Code Status: Full (per discussion on admission)  Chief Complaint: chest pain  Assessment and Plan: Beth Pineda is a 64 y.o. female presenting with chest pain and palpitations x 4 days. PMH is significant for HTN, large complex kidney cyst, and tobacco abuse   Chest Pain/Palpitations: Occuring intermittently for past 4 days. HEART score: 4. EKG shows T wave inversions in V1 and V2 with flattening in V3 (without previous EKGs on file) and frequent PVCs. Troponin neg x1 (0.00). CXR WNL. Differentials include MI due to "sharp" pain and jaw pain, and risk factors but unlikely as no acute ST changes on EKG and negative troponin. Most likely not musculoskeletal in origin because pain is not reproducible. Not likely pericarditis as patient does not have EKG changes and chest pain is not improved with sitting up and is not pleuritic. Could be due to anxiety which would explain the intermittent nature of the non-exertional chest pain, palpitations, and occasional shortness of breath. Frequent PVCs could also be causing her some discomfort with short, intermittent palpitations.  - Admit to telemetry under Dr. Mingo Amber - cycle Troponins - repeat EKG in AM - risk stratification labs- TSH, lipid panel, A1c - consult cards if troponins rise and EKG changes are seen  - if symptoms continue to be frequent, consider c/s cardiology for possible Holter monitor.   UTI: Admits to increased frequency, dysuria intermittently, malodorous/dark urine. Patient has had recurrent UTIs in past. UA reveals large leukocytes with positive nitrites.  - Begin IV Ceftriaxone, can consider transition to PO tomorrow.    HTN: Takes Amlodipine 10mg  daily at  home. Currently controlled to slightly elevated from 157-99/99-51.  - Continue Amlodipine  Tobacco Abuse: Typically smokes 1/2 PPD. 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 She saw Urology in April 2010 at [facility] and Dr. 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 According to records, patient did not follow up with urology. It seemed like Dr. Alford Highland was going to review further imaging such as MRI with the radiologist but a record of this MRI cannot be found.   - Consider outpatient urology follow up  FEN/GI: Saline lock, heart healthy diet Prophylaxis: Lovenox  Disposition: Admit to telemetry  History of Present Illness:  Beth Pineda is a 64 y.o. female presenting with chest pain and palpitations for the past 4 days. On Thursday, the patient started noticing intermittent chest pain that lasted for approximately 30 seconds. The chest pain is left sided, sharp and sometimes radiates to her back between her shoulder blades. Nothing makes the pain worse, she does not note it comes with exertion. Nothing alleviates the pain.  This chest pain was associated with heart palpitations, jaw pain bilaterally, and shortness of breath. She also endorses lightheadedness, dizziness, flushing. Sometimes it is difficult to breath with her palpitations. She's noticed some imbalance over the last few days. She noted some tingling in her R fingers  while driving but this resolved. She was unsure if it was due to the cold. She also endorses some dark, malodorous urine, occasional dysuria that improves with hydration.  No nausea or vomiting, diaphoresis,  headache, fevers, change in vision, and recent injuries.  She was initially seen at urgent care and had T wave inversions in V1-V3 with numerous PVC without a past EKG for comparison. She was given 3 additional ASA 81mg , started on O2, and transferred to [facility] ED. CXR was clear, EKG was stable, and troponin was negative x 1.   Review Of Systems: Per HPI with the following additions: constipation Otherwise 12 point review of systems was performed and was unremarkable.  Patient Active Problem List   Diagnosis Date Noted  . Chest pain 05/12/2015  . Sepsis secondary to UTI ([place]) 09/19/2014  . Pyelonephritis, acute 09/19/2014   Past Medical History: Past Medical History  Diagnosis Date  . Hypertension   . Kidney cysts    Past Surgical History: Past Surgical History  Procedure Laterality Date  . Hernia repair    . Abdominal hysterectomy    . Cesarean section    . Shoulder surgery     Social History: Social History  Substance Use Topics  . Smoking status: Current Every Day Smoker -- 0.50 packs/day    Types: Cigarettes  . Smokeless tobacco: None  . Alcohol Use: Yes  The patient smokes 1ppd on and off since she was 18 and currently smokes 5 cigarettes per day. Additional social history: Denies illicit drug use and has 1 wine cooler/week  Please also refer to relevant sections of EMR.  Family History: History reviewed. No pertinent family history. Allergies and Medications: Allergies  Allergen Reactions  . Ciprofloxacin     Shoulder locked up  Hypertension: Has tried lisinopril in the past and it made sick to her stomach, "had breathing" problems.    No current facility-administered medications on file prior to encounter.   Current Outpatient Prescriptions on File Prior to Encounter  Medication Sig Dispense Refill  . acetaminophen (TYLENOL) 500 MG tablet Take 500 mg by mouth every 6 (six) hours as needed for mild pain, moderate pain or fever.    Marland Kitchen amLODipine (NORVASC)  10 MG tablet Take 10 mg by mouth daily.    Marland Kitchen aspirin 81 MG tablet Take 81 mg by mouth daily.    . cefUROXime (CEFTIN) 250 MG tablet Take 1 tablet (250 mg total) by mouth 2 (two) times daily with a meal. 8 tablet 0  . cholecalciferol (VITAMIN D) 1000 UNITS tablet Take 1,000 Units by mouth daily.    Marland Kitchen COENZYME Q-10 PO Take 50 mg by mouth daily.    Marland Kitchen oxyCODONE-acetaminophen (ROXICET) 5-325 MG per tablet Take 1 tablet by mouth every 4 (four) hours as needed for severe pain. 20 tablet 0  . traMADol (ULTRAM) 50 MG tablet Take 1 tablet (50 mg total) by mouth every 6 (six) hours as needed. (Patient not taking: Reported on 09/19/2014) 15 tablet 0    Objective: BP 99/51 mmHg  Pulse 28  Temp(Src) 97.7 F (36.5 C) (Oral)  Resp 18  SpO2 99% Exam: General: In NAD, patient laying in bed speaking in full sentences Eyes: PERRLA ENTM: normal tympanic membranes, no nasal discharge, no pharyngeal erythema. MMM Neck: supple, no JVD Cardiovascular: RRR, normal s1 and s2. No rubs, gallops, or murmurs Pain not reproducible to palpation over the anterior chest wall.  Respiratory: CTAB without wheezing, rhonchi, or crackles. Abdomen: soft, non tender,  non distended, normal bowel sounds, no organomegaly, healed scar in midline  MSK: Full ROM in all joints, 5/5 strength in bilateral upper and lower extremities Skin: No rashes Neuro: AO x3, CN 2-12 intact, sensation intact throughout Psych: Normal mood and affect  Labs and Imaging: CBC BMET   Recent Labs Lab 05/12/15 1648  WBC 8.1  HGB 14.0  HCT 42.1  PLT 241    Recent Labs Lab 05/12/15 1648  NA 136  K 4.1  CL 105  CO2 23  BUN 13  CREATININE 0.80  GLUCOSE 83  CALCIUM 9.1      Troponin (Point of Care Test)  Recent Labs  05/12/15 1701  TROPIPOC 0.00   Urinalysis    Component Value Date/Time   COLORURINE AMBER* 09/19/2014 1732   APPEARANCEUR TURBID* 09/19/2014 1732   LABSPEC <=1.005 05/12/2015 1529   PHURINE 5.5 05/12/2015 1529    GLUCOSEU NEGATIVE 05/12/2015 1529   HGBUR SMALL* 05/12/2015 1529   BILIRUBINUR NEGATIVE 05/12/2015 1529   KETONESUR NEGATIVE 05/12/2015 1529   PROTEINUR NEGATIVE 05/12/2015 1529   UROBILINOGEN 0.2 05/12/2015 1529   NITRITE POSITIVE* 05/12/2015 1529   LEUKOCYTESUR LARGE* 05/12/2015 1529       EKG: NSR with multiple PVCs, T wave inversions in V1, V2, and flattening in V3  HR 83, QTc 475  Dg Chest 2 View  05/12/2015  CLINICAL DATA:  Chest pain, shortness of breath for 4 days. EXAM: CHEST  2 VIEW COMPARISON:  October 28, 2008. FINDINGS: The heart size and mediastinal contours are within normal limits. Both lungs are clear. No pneumothorax or pleural effusion is noted. Postsurgical changes noted in right scapula. IMPRESSION: No active cardiopulmonary disease. Electronically Signed   By: Marijo Conception, M.D.   On: 05/12/2015 17:01     Carlyle Dolly, MD 05/12/2015, 6:40 PM PGY-1, [place] Intern pager: [phone], text pages welcome  Upper Level Addendum:  I have seen and evaluated this patient along with Dr. Juanito Doom and reviewed the above note, making necessary revisions in purple.   Archie Patten, MD [place] Resident, PGY-2

## 2015-05-12 NOTE — ED Provider Notes (Signed)
CSN: [id number]     Arrival date & time 05/12/15  1600 History   First MD Initiated Contact with Patient 05/12/15 1606     Chief Complaint  Patient presents with  . Chest Pain  . Abnormal ECG   History of obtained through patient and medical records. HPI Pt was seen in the urgent care by Dr Bridgett Larsson just prior to arrival.  According to her notes:  "She is a 64 year old woman here for evaluation of chest pain. She has a history of well-controlled hypertension. She is a current every day smoker. She states she started having intermittent chest pains 3 days ago. They occur randomly. They're located in the left anterior chest. She states they are a sharp piercing pain. They last seconds to minutes. They are not associated with exertion. She will often get pain across her upper back with the pains. Sometimes, she will get an aching sensation in her neck and jaw with the pains. She also reports associated shortness of breath, palpitations, and dizziness. She also reports generalized fatigue over the last several days. She does have a positive family history for cardiac disease as her father died of a heart attack in his 39s."  Pt confirms that this history is accurate.  She is not currently experiencing any pain.  She has been trying to cut back on her smoking and started doing that after the pain started because the pain scared her.  No known history of heart disease.  No leg swelling.  No history of lung disease, PE. Past Medical History  Diagnosis Date  . Hypertension   . Kidney cysts    Past Surgical History  Procedure Laterality Date  . Hernia repair    . Abdominal hysterectomy    . Cesarean section    . Shoulder surgery     History reviewed. No pertinent family history. Social History  Substance Use Topics  . Smoking status: Current Every Day Smoker -- 0.50 packs/day    Types: Cigarettes  . Smokeless tobacco: None  . Alcohol Use: Yes   OB History    No data available     Review of  Systems  All other systems reviewed and are negative.     Allergies  Ciprofloxacin  Home Medications   Prior to Admission medications   Medication Sig Start Date End Date Taking? Authorizing Provider  acetaminophen (TYLENOL) 500 MG tablet Take 500 mg by mouth every 6 (six) hours as needed for mild pain, moderate pain or fever.    Historical Provider, MD  amLODipine (NORVASC) 10 MG tablet Take 10 mg by mouth daily.    Historical Provider, MD  aspirin 81 MG tablet Take 81 mg by mouth daily.    Historical Provider, MD  cefUROXime (CEFTIN) 250 MG tablet Take 1 tablet (250 mg total) by mouth 2 (two) times daily with a meal. 09/20/14   Donne Hazel, MD  cholecalciferol (VITAMIN D) 1000 UNITS tablet Take 1,000 Units by mouth daily.    Historical Provider, MD  COENZYME Q-10 PO Take 50 mg by mouth daily.    Historical Provider, MD  oxyCODONE-acetaminophen (ROXICET) 5-325 MG per tablet Take 1 tablet by mouth every 4 (four) hours as needed for severe pain. 09/20/14   Donne Hazel, MD  traMADol (ULTRAM) 50 MG tablet Take 1 tablet (50 mg total) by mouth every 6 (six) hours as needed. Patient not taking: Reported on 09/19/2014 12/06/13   Gregor Hams, MD   BP 99/51 mmHg  Pulse 28  Temp(Src) 97.7 F (36.5 C) (Oral)  Resp 18  SpO2 99% Physical Exam  Constitutional: She appears well-developed and well-nourished. No distress.  HENT:  Head: Normocephalic and atraumatic.  Right Ear: External ear normal.  Left Ear: External ear normal.  Eyes: Conjunctivae are normal. Right eye exhibits no discharge. Left eye exhibits no discharge. No scleral icterus.  Neck: Neck supple. No tracheal deviation present.  Cardiovascular: Normal rate, regular rhythm and intact distal pulses.   Pulmonary/Chest: Effort normal and breath sounds normal. No stridor. No respiratory distress. She has no wheezes. She has no rales.  Abdominal: Soft. Bowel sounds are normal. She exhibits no distension. There is no tenderness. There  is no rebound and no guarding.  Musculoskeletal: She exhibits no edema or tenderness.  Neurological: She is alert. She has normal strength. No cranial nerve deficit (no facial droop, extraocular movements intact, no slurred speech) or sensory deficit. She exhibits normal muscle tone. She displays no seizure activity. Coordination normal.  Skin: Skin is warm and dry. No rash noted.  Psychiatric: She has a normal mood and affect.  Nursing note and vitals reviewed.   ED Course  Procedures (including critical care time) Labs Review Labs Reviewed  CBC - Abnormal; Notable for the following:    RBC 5.27 (*)    All other components within normal limits  BASIC METABOLIC PANEL  APTT  PROTIME-INR  I-STAT TROPOININ, ED    Imaging Review Dg Chest 2 View  05/12/2015  CLINICAL DATA:  Chest pain, shortness of breath for 4 days. EXAM: CHEST  2 VIEW COMPARISON:  October 28, 2008. FINDINGS: The heart size and mediastinal contours are within normal limits. Both lungs are clear. No pneumothorax or pleural effusion is noted. Postsurgical changes noted in right scapula. IMPRESSION: No active cardiopulmonary disease. Electronically Signed   By: Marijo Conception, M.D.   On: 05/12/2015 17:01   I have personally reviewed and evaluated these images and lab results as part of my medical decision-making.   EKG Interpretation   Date/Time:  Sunday May 12 2015 16:11:52 EDT Ventricular Rate:  83 PR Interval:  172 QRS Duration: 86 QT Interval:  404 QTC Calculation: 475 R Axis:   33 Text Interpretation:  Sinus rhythm Ventricular trigeminy , new since prior  tracing Consider right atrial enlargement Low voltage, precordial leads  Anteroseptal infarct, old Confirmed by Victor Granados  MD-J, Mahir Prabhakar ([id number]) on  05/12/2015 5:49:19 PM      MDM   Final diagnoses:  Chest pain, unspecified chest pain type    Pt's sx are concerning for anginal chest pain.  I calculate a heart score of 5, moderate risk.  Labs are   Unremarkable.  CXR normal.  I will consult with medical service regarding admission for further evaluation.   Dorie Rank, MD 05/12/15 1754

## 2015-05-13 ENCOUNTER — Observation Stay (HOSPITAL_BASED_OUTPATIENT_CLINIC_OR_DEPARTMENT_OTHER): Payer: BLUE CROSS/BLUE SHIELD

## 2015-05-13 ENCOUNTER — Observation Stay (HOSPITAL_COMMUNITY): Payer: BLUE CROSS/BLUE SHIELD

## 2015-05-13 DIAGNOSIS — R079 Chest pain, unspecified: Secondary | ICD-10-CM

## 2015-05-13 DIAGNOSIS — I10 Essential (primary) hypertension: Secondary | ICD-10-CM

## 2015-05-13 DIAGNOSIS — R072 Precordial pain: Secondary | ICD-10-CM | POA: Diagnosis not present

## 2015-05-13 DIAGNOSIS — R071 Chest pain on breathing: Secondary | ICD-10-CM

## 2015-05-13 DIAGNOSIS — I493 Ventricular premature depolarization: Secondary | ICD-10-CM

## 2015-05-13 DIAGNOSIS — R0602 Shortness of breath: Secondary | ICD-10-CM | POA: Diagnosis not present

## 2015-05-13 DIAGNOSIS — R5383 Other fatigue: Secondary | ICD-10-CM | POA: Diagnosis not present

## 2015-05-13 DIAGNOSIS — Z720 Tobacco use: Secondary | ICD-10-CM | POA: Diagnosis not present

## 2015-05-13 LAB — NM MYOCAR MULTI W/SPECT W/WALL MOTION / EF
CHL CUP MPHR: 156 {beats}/min
CHL CUP RESTING HR STRESS: 76 {beats}/min
CHL RATE OF PERCEIVED EXERTION: 16
CSEPHR: 93 %
Estimated workload: 6.4 METS
Exercise duration (min): 4 min
Exercise duration (sec): 31 s
LHR: 0.28
LVDIAVOL: 56 mL
LVSYSVOL: 34 mL
Peak HR: 146 {beats}/min
SDS: 2
SRS: 2
SSS: 4
TID: 1.03

## 2015-05-13 LAB — TROPONIN I: Troponin I: <0.03 ng/mL (ref <0.031)

## 2015-05-13 LAB — HEMOGLOBIN A1C
HEMOGLOBIN A1C: 5.7 % — AB (ref 4.8–5.6)
MEAN PLASMA GLUCOSE: 117 mg/dL

## 2015-05-13 MED ORDER — TECHNETIUM TC 99M SESTAMIBI GENERIC - CARDIOLITE
10.0000 | Freq: Once | INTRAVENOUS | Status: AC | PRN
  Administered 2015-05-13: 10 via INTRAVENOUS

## 2015-05-13 MED ORDER — TECHNETIUM TC 99M SESTAMIBI - CARDIOLITE
30.0000 | Freq: Once | INTRAVENOUS | Status: AC | PRN
  Administered 2015-05-13: 30 via INTRAVENOUS

## 2015-05-13 MED ORDER — METOPROLOL TARTRATE 12.5 MG HALF TABLET
12.5000 mg | ORAL_TABLET | Freq: Two times a day (BID) | ORAL | Status: DC
  Administered 2015-05-13 – 2015-05-14 (×3): 12.5 mg via ORAL
  Filled 2015-05-13 (×3): qty 1

## 2015-05-13 MED ORDER — PNEUMOCOCCAL VAC POLYVALENT 25 MCG/0.5ML IJ INJ
0.5000 mL | INJECTION | INTRAMUSCULAR | Status: DC
  Filled 2015-05-13: qty 0.5

## 2015-05-13 MED ORDER — CEPHALEXIN 500 MG PO CAPS
500.0000 mg | ORAL_CAPSULE | Freq: Four times a day (QID) | ORAL | Status: DC
Start: 2015-05-13 — End: 2015-05-14
  Administered 2015-05-13 – 2015-05-14 (×4): 500 mg via ORAL
  Filled 2015-05-13 (×4): qty 1

## 2015-05-13 NOTE — Progress Notes (Signed)
Echocardiogram 2D Echocardiogram has been performed.  Beth Pineda 05/13/2015, 2:15 PM

## 2015-05-13 NOTE — Consult Note (Signed)
CARDIOLOGY CONSULT NOTE   Patient ID: Beth Pineda MRN: [id number] DOB/AGE: [birth year] 64 y.o.  Admit date: 05/12/2015  Primary Physician   No PCP Per Patient Primary Cardiologist   New Reason for Consultation  CP  HPI: Beth Pineda is a 64 y.o. female with a history of HTN and current smoker who admitted 05/12/15 for chest pain.  No prior cardiac history. Never seen by any cardiologist. He takes amlodipine for hypertension. She has noticed left-sided sharp chest pain 05/09/15 that was associated with shortness of breath and palpitation. Her pain resolved in a few seconds. However, intermittently she has been having chest pain and palpitation since then. She also had intermittent dizziness as well. She denies any exertional chest pain. She also noticed lower extremity edema intermittently for the past 3-4 months. She denies diaphoresis, orthopnea, PND, syncope, nausea, vomiting, recent trauma or travel. She went to urgent care yesterday 05/12/15 for further evaluation. EKG at urgent care showed T-wave inversion in lead V1 to V3 and PVCs. She was given 3 additional ASA 81mg , started on O2, and sent to ED for further evaluation. She smokes cigarette one pack a day for the past 40+ years. Denies illicit drug use. Her dad died of massive heart attack in her 24s.  She has noticed some left-sided sharp chest pain and palpitations while walking this morning. She also had palpitations intermittently since then. Currently no chest pain.   Past Medical History  Diagnosis Date  . Hypertension   . Kidney cysts      Past Surgical History  Procedure Laterality Date  . Hernia repair    . Abdominal hysterectomy    . Cesarean section    . Shoulder surgery      Allergies  Allergen Reactions  . Lisinopril Shortness Of Breath, Nausea And Vomiting and Other (See Comments)    Made patient feel real bad  . Ciprofloxacin Other (See Comments)    Shoulder locked up    I have reviewed the  patient's current medications . amLODipine  10 mg Oral QHS  . aspirin EC  81 mg Oral Daily  . cefTRIAXone (ROCEPHIN)  IV  1 g Intravenous Q24H  . enoxaparin (LOVENOX) injection  40 mg Subcutaneous Q24H  . [START ON 05/14/2015] pneumococcal 23 valent vaccine  0.5 mL Intramuscular Tomorrow-1000     acetaminophen, ondansetron (ZOFRAN) IV  Prior to Admission medications   Medication Sig Start Date End Date Taking? Authorizing Provider  amLODipine (NORVASC) 10 MG tablet Take 10 mg by mouth daily.   Yes Historical Provider, MD  aspirin 81 MG tablet Take 81 mg by mouth daily.   Yes Historical Provider, MD  COENZYME Q-10 PO Take 50 mg by mouth daily.   Yes Historical Provider, MD     Social History   Social History  . Marital Status: Legally Separated    Spouse Name: N/A  . Number of Children: N/A  . Years of Education: N/A   Occupational History  . Not on file.   Social History Main Topics  . Smoking status: Current Every Day Smoker -- 0.50 packs/day    Types: Cigarettes  . Smokeless tobacco: Not on file  . Alcohol Use: Yes  . Drug Use: No  . Sexual Activity: Yes   Other Topics Concern  . Not on file   Social History Narrative    No family status information on file.   History reviewed. No pertinent family history.   ROS:  Full 14 point  review of systems complete and found to be negative unless listed above.  Physical Exam: Blood pressure 128/73, pulse 79, temperature 97.8 F (36.6 C), temperature source Oral, resp. rate 15, height 5\' 6"  (1.676 m), weight 153 lb 3.2 oz (69.491 kg), SpO2 99 %.  General: Well developed, well nourished, female in no acute distress Head: Eyes PERRLA, No xanthomas. Normocephalic and atraumatic, oropharynx without edema or exudate.  Lungs: Resp regular and unlabored, CTA. Heart: RRR no s3, s4, or murmurs..   Neck: No carotid bruits. No lymphadenopathy.  JVD. Abdomen: Bowel sounds present, abdomen soft and non-tender without masses or hernias  noted. Msk:  No spine or cva tenderness. No weakness, no joint deformities or effusions. Extremities: No clubbing, cyanosis or edema. DP/PT/Radials 2+ and equal bilaterally. Neuro: Alert and oriented X 3. No focal deficits noted. Psych:  Good affect, responds appropriately Skin: No rashes or lesions noted.  Labs:   Lab Results  Component Value Date   WBC 8.1 05/12/2015   HGB 14.0 05/12/2015   HCT 42.1 05/12/2015   MCV 79.9 05/12/2015   PLT 241 05/12/2015    Recent Labs  05/12/15 1648  INR 1.00    Recent Labs Lab 05/12/15 1648  NA 136  K 4.1  CL 105  CO2 23  BUN 13  CREATININE 0.80  CALCIUM 9.1  GLUCOSE 83   No results found for: MG  Recent Labs  05/12/15 2015 05/13/15 0120 05/13/15 0706  TROPONINI <0.03 <0.03 <0.03    Recent Labs  05/12/15 1701  TROPIPOC 0.00   No results found for: PROBNP Lab Results  Component Value Date   CHOL 205* 05/12/2015   HDL 47 05/12/2015   LDLCALC 145* 05/12/2015   TRIG 64 05/12/2015   No results found for: DDIMER No results found for: LIPASE, AMYLASE TSH  Date/Time Value Ref Range Status  05/12/2015 08:35 PM 1.879 0.350 - 4.500 uIU/mL Final    Echo: Pending  ECG:  Vent. rate 76 BPM PR interval 172 ms QRS duration 80 ms QT/QTc 402/452 ms P-R-T axes 64 6 30  Radiology:  Dg Chest 2 View  05/12/2015  CLINICAL DATA:  Chest pain, shortness of breath for 4 days. EXAM: CHEST  2 VIEW COMPARISON:  October 28, 2008. FINDINGS: The heart size and mediastinal contours are within normal limits. Both lungs are clear. No pneumothorax or pleural effusion is noted. Postsurgical changes noted in right scapula. IMPRESSION: No active cardiopulmonary disease. Electronically Signed   By: Marijo Conception, M.D.   On: 05/12/2015 17:01    ASSESSMENT AND PLAN:     1. Chest pain - Chest pain is concerning for cardiac etiology. She has a significant risk factor including current tobacco smoker, hypertension and strong family history of  heart disease. - EKG with a T-wave inversion in the septal lead. Troponin x 3 negative. - Will get echocardiogram and treadmill Myoview today.  - Will start her on metoprolol 12.5 MG twice a day. - TSH normal,  hemoglobin A1c 5.7, LDL 145. CXR normal. Start statin, pending results. Check LFT prior to starting.   2. Palpitations - As above  3. Tobacco abuse - Encouraged smoking cessation. Education given.  4.Hypertension - Stable and well controlled. Continue amlodipine  5. UTI? -Asymptomatic. UA with Large leuko and + Nitrite. Per primary.    SignedLeanor Kail, PA 05/13/2015, 9:40 AM  Patient seen and examined and history reviewed. Agree with above findings and plan. Pleasant 64 yo BF admitted for evaluation of  chest pain and palpitations. Pain described as sharp, stabbing. Some aching in jaws. Intermittent pain in posterior neck. Complains of palpitations that correlate with frequent PVCs noted on monitor. She has cardiac risk factors of HTN, hypercholesterolemia, smoking, and family history of premature CAD. No evidence of MI by serial enzymes. No diagnostic Ecg changes. Recommend further evaluation with Echo and a stress Myoview today. Will start metoprolol for PVCs. Counseled on smoking cessation and reducing caffeine. Also discussed lipid lowering therapy with a statin. She is reluctant to take due to concerns about it causing diabetes. If we do find she has CAD I would strongly advocate her taking a statin to reduce CV risk.   Peter Martinique, [place] 05/13/2015 10:07 AM

## 2015-05-13 NOTE — Progress Notes (Signed)
Patient arrived to unit from ED via stretcher. Patient alert, oriented and ambulatory. Patient denies any chest pain or SOB at this time. Admission weight, vitals and assessment completed. Fall and safety plan reviewed with patient. Patient currently resting comfortably, call light within reach. Will continue to monitor. Blood pressure 139/91, pulse 69, temperature 97.9 F (36.6 C), temperature source Oral, resp. rate 20, height 5\' 6"  (1.676 m), weight 69.491 kg (153 lb 3.2 oz), SpO2 100 %. Tresa Endo

## 2015-05-13 NOTE — Progress Notes (Signed)
Family Medicine Teaching Service Daily Progress Note Intern Pager: [phone]  Patient name: Beth Pineda Medical record number: [id number] Date of birth: 08-16-1950 Age: 63 y.o. Gender: female  Primary Care Provider: No PCP Per Patient Consultants: none Code Status: Full   Pt Overview and Major Events to Date:   Chief Complaint: chest pain  Assessment and Plan: Beth Pineda is a 64 y.o. female presenting with chest pain and palpitations x 4 days. PMH is significant for HTN, large complex kidney cyst, and tobacco abuse   Chest Pain/Palpitations: Patient states still having chest pain this morning, occurred while patient was up walking around, did not improve when patient rested.  HEART score: 4. EKG shows T wave inversions in V1 and V2 with flattening in V3 (without previous EKGs on file) and frequent PVCs. Troponin neg x1 (0.00). CXR WNL. Differentials include MI due to "sharp" pain and jaw pain, and risk factors but unlikely as no acute ST changes on EKG and negative troponin. Most likely not musculoskeletal in origin because pain is not reproducible. Not likely pericarditis as patient does not have EKG changes and chest pain is not improved with sitting up and is not pleuritic. Could be due to anxiety which would explain the intermittent nature of the non-exertional chest pain, palpitations, and occasional shortness of breath. Frequent PVCs could also be causing her some discomfort with short, intermittent palpitations.  - Troponin negative x 2 - repeat EKG in the AM, without PVCs this AM, regular sinus rhythm  - TSH wnl, HA1c Pending  - Consider ECHO ?  - ASCVD risk 11%, patient does not want to start statin at this time  - Start Metoprolol 12.5 mg BID  - if symptoms continue to be frequent, consider c/s cardiology for possible Holter monitor.   UTI: Patient denies any suprapubic pain, n/v, or chills currently. On admission increased frequency, dysuria intermittently, malodorous/dark  urine. Patient has had recurrent UTIs in past. UA reveals large leukocytes with positive nitrites.  - Begin IV Ceftriaxone, transition to PO Keflex today  - Urine culture NGTD    HTN:  BP 128/73 Takes Amlodipine 10mg  daily at home. Currently controlled.  - Continue Amlodipine  Tobacco Abuse: Typically smokes 1/2 PPD. 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 She saw Urology in April 2010 at [facility] and Dr. 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 According to records, patient did not follow up with urology. It seemed like Dr. Alford Highland was going to review further imaging such as MRI with the radiologist but a record of this MRI cannot be found.   - Consider outpatient urology follow up  FEN/GI: Saline lock, heart healthy diet Prophylaxis: Lovenox  Disposition: Admit to telemetry  Subjective:  Patient states that when she got up this morning, she has some chest pain while walking around. This pain resolved eventually once she sat down in the bed. She has been prescribed   Objective: Temp:  [97.7 F (36.5 C)-98.1 F (36.7 C)] 98 F (36.7 C) (10/24 0507) Pulse Rate:  [28-90] 79 (10/24 0507) Resp:  [16-27] 20 (10/23 1930) BP: (99-157)/(51-101) 108/75 mmHg (10/24 0507) SpO2:  [  99 %-100 %] 99 % (10/24 0507) Weight:  [153 lb 3.2 oz (69.491 kg)] 153 lb 3.2 oz (69.491 kg) (10/23 1930) Physical Exam: General: Patient lying in bed, NAD Cardiovascular: RRR, no murmurs, rubs or gallops Respiratory: CTAB, normal WOB,  Abdomen: BS+, no ttp to palpation  Extremities: no lower extremity edema, 2+ pedal pulses    Laboratory:  Recent Labs Lab 05/12/15 1648  WBC 8.1  HGB 14.0  HCT 42.1  PLT 241    Recent Labs Lab 05/12/15 1648  NA 136  K 4.1  CL 105  CO2 23  BUN 13  CREATININE 0.80  CALCIUM 9.1  GLUCOSE 83    Imaging/Diagnostic Tests:  Dg Chest 2 View  05/12/2015  CLINICAL DATA:  Chest pain, shortness of breath for 4 days. EXAM: CHEST  2 VIEW COMPARISON:  October 28, 2008. FINDINGS: The heart size and mediastinal contours are within normal limits. Both lungs are clear. No pneumothorax or pleural effusion is noted. Postsurgical changes noted in right scapula. IMPRESSION: No active cardiopulmonary disease. Electronically Signed   By: Marijo Conception, M.D.   On: 05/12/2015 17:01    Donaldson Richter Cletis Media, MD 05/13/2015, 7:12 AM PGY-1, [place] Intern pager: [phone], text pages welcome

## 2015-05-13 NOTE — Progress Notes (Signed)
Patient presented for Treadmill Myoview. Tolerated procedure well. Result to follow.   Janaki Exley, [place]

## 2015-05-13 NOTE — Progress Notes (Signed)
Echo and Myoview studies reviewed. No ischemia or infarct on perfusion imaging. EF low normal by Echo ( I reviewed personally). EF 38% by Myoview. I suspect falsely low EF on Myoview due to PVCs and difficulty gating. I would recommend beta blocker for PVCs. Risk factor modification. Stop smoking. We can see in office for follow up .  Sharlene Mccluskey [place] MD, [facility]

## 2015-05-14 DIAGNOSIS — Z720 Tobacco use: Secondary | ICD-10-CM | POA: Diagnosis not present

## 2015-05-14 DIAGNOSIS — R071 Chest pain on breathing: Secondary | ICD-10-CM | POA: Diagnosis not present

## 2015-05-14 DIAGNOSIS — I10 Essential (primary) hypertension: Secondary | ICD-10-CM | POA: Diagnosis not present

## 2015-05-14 DIAGNOSIS — I493 Ventricular premature depolarization: Secondary | ICD-10-CM | POA: Diagnosis present

## 2015-05-14 DIAGNOSIS — R072 Precordial pain: Secondary | ICD-10-CM | POA: Diagnosis not present

## 2015-05-14 DIAGNOSIS — R079 Chest pain, unspecified: Secondary | ICD-10-CM | POA: Diagnosis not present

## 2015-05-14 MED ORDER — METOPROLOL SUCCINATE ER 25 MG PO TB24: 25.0000 mg | ORAL_TABLET | Freq: Every day | ORAL | Status: DC

## 2015-05-14 MED ORDER — CEPHALEXIN 500 MG PO CAPS: 500.0000 mg | ORAL_CAPSULE | Freq: Two times a day (BID) | ORAL | Status: DC

## 2015-05-14 NOTE — Discharge Summary (Signed)
[facility] Discharge Summary  Patient name: Beth Pineda Medical record number: [id number] Date of birth: 11-14-1950 Age: 64 y.o. Gender: female Date of Admission: 05/12/2015  Date of Discharge: 05/15/2015 Admitting Physician: Alveda Reasons, MD  Primary Care Provider: No PCP Per Patient Consultants: Cardiology  Indication for Hospitalization: Chest pain  Discharge Diagnoses/Problem List:  Uncomplicated, UTI  HTN  Tobacco Abuse  Kidney Cysts   Disposition: Home   Discharge Condition: Stable   Discharge Exam:  Physical Exam: General: Patient lying in bed, NAD Cardiovascular: RRR, no murmurs, rubs or gallops Respiratory: CTAB, normal WOB,  Abdomen: BS+, no ttp to palpation  Extremities: no lower extremity edema, 2+ pedal pulses   Brief Hospital Course:   Patient was admitted for chest pain and palpations. Patient has history significant for hypertension, large complex renal cysts, smoker. Patient stated that she's had increasing chest pain and palpitations for the past several days. EKG there showed flattened T waves in V1-V3 and multiple PVCs.Troponin were negative x 3 over the next 24 hours. Repeat EKG in the AM was without PVCs this AM, regular sinus rhythm. Cardiology evaluated patient and did a Myoview Nuclear Stress test, finding no reversible perfusion defects. However, it did note that there was global hypokinesis, LVEF 38%. However, an ECHO performed found an a normal left ventricle, including cavity size with an estimated ejection fraction was in the range of 50% to 55%. Per cardiology thinking that the lower Myoview ejection fraction was false due to PVCs and difficulty gating. Risk stratification included TSH wnl, HA1 at 5.7 (prediabetic).   Patient was also admitted with increased frequency, dysuria intermittently, malodorous/dark urine. Patient has had recurrent UTIs in past. UA reveals large leukocytes with positive nitrites. Found  to have a urine culture positive with > 100,000 E.coli. Patient received 1 day of Ceftriaxone, with transition to PO Keflex for a total of  5 days/   Issues for Follow Up:  1. Patient was started on a metoprolol succinate during this admission, please follow up on this  2. Patient was also treated for an uncomplicated UTI with Keflex for a total of 5 days with 05/17/2015 being the last day  3. Patient has an ASCVD risk of 11%, however she is refusing statin currently, and would like to diet 4. HA1C was 5.7, patient is prediabetic, will need to discuss with patient  5. Discuss patient's smoking status, discuss the need to quite smoking.    Significant Procedures:   ECHO  - Left ventricle: The cavity size was normal. Systolic function was normal. The estimated ejection fraction was in the range of 50% to 55%. Wall motion was normal; there were no regional wall motion abnormalities. There was an increased relative contribution of atrial contraction to ventricular filling. Doppler parameters are consistent with abnormal left ventricular relaxation (grade 1 diastolic dysfunction). - Mitral valve: There was trivial regurgitation.  Significant Labs and Imaging:   Recent Labs Lab 05/12/15 1648  WBC 8.1  HGB 14.0  HCT 42.1  PLT 241    Recent Labs Lab 05/12/15 1648  NA 136  K 4.1  CL 105  CO2 23  GLUCOSE 83  BUN 13  CREATININE 0.80  CALCIUM 9.1   Results for Beth Pineda (MRN [id number]) as of 05/16/2015 16:40  Ref. Range 05/12/2015 20:35  Hemoglobin A1C Latest Ref Range: 4.8-5.6 % 5.7 (H)    Results for Beth Pineda (MRN [id number]) as of 05/16/2015 16:40  Ref. Range 05/12/2015  20:35  TSH Latest Ref Range: 0.350-4.500 uIU/mL 1.879   Results for Beth Pineda (MRN [id number]) as of 05/16/2015 16:40  Ref. Range 05/12/2015 20:15  Cholesterol Latest Ref Range: 0-200 mg/dL 205 (H)  Triglycerides Latest Ref Range: <150 mg/dL 64  HDL Cholesterol Latest Ref  Range: >40 mg/dL 47  LDL (calc) Latest Ref Range: 0-99 mg/dL 145 (H)  VLDL Latest Ref Range: 0-40 mg/dL 13  Total CHOL/HDL Ratio Latest Units: RATIO 4.4     Results/Tests Pending at Time of Discharge: None   Discharge Medications:    Medication List    ASK your doctor about these medications        amLODipine 10 MG tablet  Commonly known as:  NORVASC  Take 10 mg by mouth daily.     aspirin 81 MG tablet  Take 81 mg by mouth daily.     COENZYME Q-10 PO  Take 50 mg by mouth daily.        Discharge Instructions: Please refer to Patient Instructions section of EMR for full details.  Patient was counseled important signs and symptoms that should prompt return to medical care, changes in medications, dietary instructions, activity restrictions, and follow up appointments.   Follow-Up Appointments:     Follow-up Information    Follow up with [facility] On 05/31/2015.   Specialty:  Cardiology   Why:  @10 :30am for cardiology f/u with Lonn Georgia, PA-C   Contact information:   [address] [place], MD 05/14/2015, 12:13 PM PGY-1, [place]

## 2015-05-14 NOTE — Progress Notes (Signed)
TELEMETRY: Reviewed telemetry pt in NSR with occ. Unifocal PVCs with some trigeminy: Filed Vitals:   05/13/15 2000 05/14/15 0003 05/14/15 0528 05/14/15 0801  BP: 108/64 129/80 121/80   Pulse: 69 70 65 70  Temp: 98.3 F (36.8 C) 97.7 F (36.5 C) 97.8 F (36.6 C)   TempSrc: Oral Oral Oral   Resp: 18 18 18    Height:      Weight:   69.037 kg (152 lb 3.2 oz)   SpO2: 99% 98% 100%     Intake/Output Summary (Last 24 hours) at 05/14/15 0906 Last data filed at 05/14/15 0500  Gross per 24 hour  Intake    600 ml  Output    600 ml  Net      0 ml   Filed Weights   05/12/15 1930 05/14/15 0528  Weight: 69.491 kg (153 lb 3.2 oz) 69.037 kg (152 lb 3.2 oz)    Subjective Feels well. Felt some palpitations earlier today.   Marland Kitchen amLODipine  10 mg Oral QHS  . aspirin EC  81 mg Oral Daily  . cephALEXin  500 mg Oral Q6H  . enoxaparin (LOVENOX) injection  40 mg Subcutaneous Q24H  . metoprolol tartrate  12.5 mg Oral BID  . pneumococcal 23 valent vaccine  0.5 mL Intramuscular Tomorrow-1000      LABS: Basic Metabolic Panel:  Recent Labs  05/12/15 1648  NA 136  K 4.1  CL 105  CO2 23  GLUCOSE 83  BUN 13  CREATININE 0.80  CALCIUM 9.1   Liver Function Tests: No results for input(s): AST, ALT, ALKPHOS, BILITOT, PROT, ALBUMIN in the last 72 hours. No results for input(s): LIPASE, AMYLASE in the last 72 hours. CBC:  Recent Labs  05/12/15 1648  WBC 8.1  HGB 14.0  HCT 42.1  MCV 79.9  PLT 241   Cardiac Enzymes:  Recent Labs  05/12/15 2015 05/13/15 0120 05/13/15 0706  TROPONINI <0.03 <0.03 <0.03   BNP: No results for input(s): PROBNP in the last 72 hours. D-Dimer: No results for input(s): DDIMER in the last 72 hours. Hemoglobin A1C:  Recent Labs  05/12/15 2035  HGBA1C 5.7*   Fasting Lipid Panel:  Recent Labs  05/12/15 2015  CHOL 205*  HDL 47  LDLCALC 145*  TRIG 64  CHOLHDL 4.4   Thyroid Function Tests:  Recent Labs  05/12/15 2035  TSH 1.879      Radiology/Studies:  Dg Chest 2 View  05/12/2015  CLINICAL DATA:  Chest pain, shortness of breath for 4 days. EXAM: CHEST  2 VIEW COMPARISON:  October 28, 2008. FINDINGS: The heart size and mediastinal contours are within normal limits. Both lungs are clear. No pneumothorax or pleural effusion is noted. Postsurgical changes noted in right scapula. IMPRESSION: No active cardiopulmonary disease. Electronically Signed   By: Marijo Conception, M.D.   On: 05/12/2015 17:01   Nm Myocar Multi W/spect W/wall Motion / Ef  05/13/2015   There was no ST segment deviation noted during stress.  T wave inversion was noted during stress in the V1 leads. Non specific  Defect 1: There is a small defect of mild severity.  This is an intermediate risk study.  Nuclear stress EF: 38%.  No reversible perfusion defects. Global hypokinesis, LVEF 38%. This is an intermediate risk study based on reduced LV systolic function. Clinical correlation is advised based on discrepant findings with echocardiography results.   Echo: 05/13/15:Study Conclusions  - Left ventricle: The cavity size was normal. Systolic  function was normal. The estimated ejection fraction was in the range of 50% to 55%. Wall motion was normal; there were no regional wall motion abnormalities. There was an increased relative contribution of atrial contraction to ventricular filling. Doppler parameters are consistent with abnormal left ventricular relaxation (grade 1 diastolic dysfunction). - Mitral valve: There was trivial regurgitation.  PHYSICAL EXAM General: Well developed, well nourished, in no acute distress. Head: Normocephalic, atraumatic, sclera non-icteric, oropharynx is clear Neck: Negative for carotid bruits. JVD not elevated. No adenopathy Lungs: Clear bilaterally to auscultation without wheezes, rales, or rhonchi. Breathing is unlabored. Heart: RRR S1 S2 without murmurs, rubs, or gallops.  Abdomen: Soft, non-tender,  non-distended with normoactive bowel sounds. No hepatomegaly. No rebound/guarding. No obvious abdominal masses. Msk:  Strength and tone appears normal for age. Extremities: No clubbing, cyanosis or edema.  Distal pedal pulses are 2+ and equal bilaterally. Neuro: Alert and oriented X 3. Moves all extremities spontaneously. Psych:  Responds to questions appropriately with a normal affect.  ASSESSMENT AND PLAN: 1. Chest pain. Normal perfusion on Myoview. Normal LV function by Echo. Continue low dose metoprolol. Will arrange follow up in our office. 2. PVCs. Still present but less frequent. Continue metoprolol. Smoking cessation. Avoid caffeine. 3. HTN well controlled. 4. Tobacco abuse. Smoking cessation.  Present on Admission:  . Chest pain  Signed, Xzavian Semmel [place], [place] 05/14/2015 9:06 AM

## 2015-05-14 NOTE — Discharge Instructions (Signed)
You were admitted for chest pain. You had several test done looking at your heart. Cardiology recommended you follow up with them as an outpatient. Please continue taking Lopressor for your heart. You can also continue taking Keflex for 3 more days for UTI.   Chest Wall Pain Chest wall pain is pain in or around the bones and muscles of your chest. Sometimes, an injury causes this pain. Sometimes, the cause may not be known. This pain may take several weeks or longer to get better. HOME CARE INSTRUCTIONS  Pay attention to any changes in your symptoms. Take these actions to help with your pain:   Rest as told by your health care provider.   Avoid activities that cause pain. These include any activities that use your chest muscles or your abdominal and side muscles to lift heavy items.   If directed, apply ice to the painful area:  Put ice in a plastic bag.  Place a towel between your skin and the bag.  Leave the ice on for 20 minutes, 2-3 times per day.  Take over-the-counter and prescription medicines only as told by your health care provider.  Do not use tobacco products, including cigarettes, chewing tobacco, and e-cigarettes. If you need help quitting, ask your health care provider.  Keep all follow-up visits as told by your health care provider. This is important. SEEK MEDICAL CARE IF:  You have a fever.  Your chest pain becomes worse.  You have new symptoms. SEEK IMMEDIATE MEDICAL CARE IF:  You have nausea or vomiting.  You feel sweaty or light-headed.  You have a cough with phlegm (sputum) or you cough up blood.  You develop shortness of breath.   This information is not intended to replace advice given to you by your health care provider. Make sure you discuss any questions you have with your health care provider.   Document Released: 07/06/2005 Document Revised: 03/27/2015 Document Reviewed: 10/01/2014 Elsevier Interactive Patient Education [employer].

## 2015-05-14 NOTE — Progress Notes (Signed)
Family Medicine Teaching Service Daily Progress Note Intern Pager: [phone]  Patient name: Beth Pineda Medical record number: [id number] Date of birth: 09-18-1950 Age: 64 y.o. Gender: female  Primary Care Provider: No PCP Per Patient Consultants: none Code Status: Full   Pt Overview and Major Events to Date:   Chief Complaint: chest pain  Assessment and Plan: Beth Pineda is a 64 y.o. female presenting with chest pain and palpitations x 4 days. PMH is significant for HTN, large complex kidney cyst, and tobacco abuse   Chest Pain/Palpitations: Patient states still having chest pain this morning, occurred while patient was up walking around, did not improve when patient rested.  HEART score: 4. EKG shows T wave inversions in V1 and V2 with flattening in V3 (without previous EKGs on file) and frequent PVCs. Troponin neg x1 (0.00). CXR WNL.Frequent PVCs could also be causing her some discomfort with short, intermittent palpitations.  - Troponin negative x x - repeat EKG in the AM, without PVCs this AM, regular sinus rhythm  - TSH wnl, HA1c Pending  - Consider ECHO Left ventricle: The cavity size was normal. Systolic function was normal. The estimated ejection fraction was in the range of 50% to 55%.,  Myoview No reversible perfusion defects. Global hypokinesis, LVEF 38%. This is an intermediate risk study based on reduced LV systolic function. Clinical correlation is advised based on discrepant findings with echocardiography results. - ASCVD risk 11%, patient does not want to start statin at this time  - Start Metoprolol 12.5 mg BID    UTI: Patient denies any suprapubic pain, n/v, or chills currently. On admission increased frequency, dysuria intermittently, malodorous/dark urine. Patient has had recurrent UTIs in past. UA reveals large leukocytes with positive nitrites.  - Begin IV Ceftriaxone, transition to PO Keflex today  - Urine culture >100,000 Gram negative rods   HTN:  BP  121/80 Takes Amlodipine 10mg  daily at home. Currently controlled.  - Continue Amlodipine  Tobacco Abuse: Typically smokes 1/2 PPD. 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 She saw Urology in April 2010 at [facility] and Dr. 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 According to records, patient did not follow up with urology. It seemed like Dr. Alford Highland was going to review further imaging such as MRI with the radiologist but a record of this MRI cannot be found.   - Consider outpatient urology follow up  FEN/GI: Saline lock, heart healthy diet Prophylaxis: Lovenox  Disposition: Admit to telemetry  Subjective:  Patient feels ready for discharge today.   Objective: Temp:  [97.7 F (36.5 C)-98.3 F (36.8 C)] 97.8 F (36.6 C) (10/25 0528) Pulse Rate:  [65-79] 65 (10/25 0528) Resp:  [15-18] 18 (10/25 0528) BP: (108-145)/(64-98) 121/80 mmHg (10/25 0528) SpO2:  [98 %-100 %] 100 % (10/25 0528) Weight:  [152 lb 3.2 oz (69.037 kg)] 152 lb 3.2 oz (69.037 kg) (10/25 0528) Physical Exam: General: Patient lying in bed, NAD Cardiovascular: RRR, no murmurs, rubs or gallops Respiratory: CTAB, normal WOB,  Abdomen: BS+, no ttp to palpation  Extremities: no lower extremity edema, 2+ pedal pulses   Laboratory:  Recent Labs Lab 05/12/15  1648  WBC 8.1  HGB 14.0  HCT 42.1  PLT 241    Recent Labs Lab 05/12/15 1648  NA 136  K 4.1  CL 105  CO2 23  BUN 13  CREATININE 0.80  CALCIUM 9.1  GLUCOSE 83    Imaging/Diagnostic Tests:  Dg Chest 2 View  05/12/2015  CLINICAL DATA:  Chest pain, shortness of breath for 4 days. EXAM:  CHEST  2 VIEW COMPARISON:  October 28, 2008. FINDINGS: The heart size and mediastinal contours are within normal limits. Both lungs are clear. No pneumothorax or pleural effusion is noted. Postsurgical changes noted in right scapula. IMPRESSION: No active cardiopulmonary disease. Electronically Signed   By: Marijo Conception, M.D.   On: 05/12/2015 17:01   Nm Myocar Multi W/spect W/wall Motion / Ef  05/13/2015   There was no ST segment deviation noted during stress.  T wave inversion was noted during stress in the V1 leads. Non specific  Defect 1: There is a small defect of mild severity.  This is an intermediate risk study.  Nuclear stress EF: 38%.  No reversible perfusion defects. Global hypokinesis, LVEF 38%. This is an intermediate risk study based on reduced LV systolic function. Clinical correlation is advised based on discrepant findings with echocardiography results.    Beth Totaro Cletis Media, MD 05/14/2015, 7:01 AM PGY-1, [place] Intern pager: [phone], text pages welcome

## 2015-05-15 LAB — URINE CULTURE: Culture: >=100000

## 2015-05-16 NOTE — ED Notes (Signed)
Urine culture report positive for E-Coli, treatment adequate w Rx printed for patient while in [facility]

## 2015-05-16 NOTE — ED Notes (Signed)
Final report of urine C&S shows E-Coli, treatment adequate w Rx provided day of visit to [facility]. Called and left message for patient to have her call us if she has any questions regarding her instructions for self care w UTI

## 2015-05-31 ENCOUNTER — Encounter: Payer: Self-pay | Admitting: Physician Assistant

## 2015-05-31 ENCOUNTER — Ambulatory Visit (INDEPENDENT_AMBULATORY_CARE_PROVIDER_SITE_OTHER): Payer: BLUE CROSS/BLUE SHIELD | Admitting: Physician Assistant

## 2015-05-31 VITALS — BP 120/80 | HR 74 | Ht 66.0 in | Wt 156.0 lb

## 2015-05-31 DIAGNOSIS — R002 Palpitations: Secondary | ICD-10-CM | POA: Diagnosis not present

## 2015-05-31 DIAGNOSIS — R072 Precordial pain: Secondary | ICD-10-CM

## 2015-05-31 NOTE — Progress Notes (Signed)
Cardiology Office Note   Date:  05/31/2015   ID:  Beth Pineda, DOB 1951/06/11, MRN [id number]  PCP:  No PCP Per Patient  Cardiologist:  New to Dr Martinique  Barrett, Rhonda, PA-C   Chief Complaint  Patient presents with  . Appointment    wants to talk with provider about complaints. weak, feels terrible.  . Depression    wants to talk to provider about complaints.    History of Present Illness: Beth Pineda is a 65 y.o. female with a history of HTN, tob use and recent admit for chest pain. Ez neg MI, MV neg ischemia, EF normal, PVCs on telem. BB added. LDL elevated at [facility] presents for post hospital follow-up.  She hates the beta blocker. She feels like when it was increased from 12.5 up to 25 mg daily, she felt that her mind was clouded, she was depressed, she was tired and she developed heartburn. There were other side effects as well. On her own, she decreased the dose back to 12.5 mg daily he and she feels a little bit better but is still having a lot of side effects. Her family physician wondered if we could change the metoprolol to verapamil.   Past Medical History  Diagnosis Date  . Hypertension   . Kidney cysts   . Hyperlipidemia     T chol 205, LDL 145 04/2015  . Tobacco use     Past Surgical History  Procedure Laterality Date  . Hernia repair    . Abdominal hysterectomy    . Cesarean section    . Shoulder surgery      Current Outpatient Prescriptions  Medication Sig Dispense Refill  . amLODipine (NORVASC) 10 MG tablet Take 10 mg by mouth daily.    Marland Kitchen aspirin 81 MG tablet Take 81 mg by mouth daily.    . cephALEXin (KEFLEX) 500 MG capsule Take 1 capsule (500 mg total) by mouth 2 (two) times daily. 6 capsule 0  . COENZYME Q-10 PO Take 50 mg by mouth daily.    . metoprolol succinate (TOPROL-XL) 25 MG 24 hr tablet Take 1 tablet (25 mg total) by mouth daily. 30 tablet 0   No current facility-administered medications for this visit.     Allergies:   Lisinopril and Ciprofloxacin    Social History:  The patient  reports that she has been smoking Cigarettes.  She has been smoking about 0.50 packs per day. She does not have any smokeless tobacco history on file. She reports that she drinks alcohol. She reports that she does not use illicit drugs.   Family History:  The patient's family history is not on file.    ROS:  Please see the history of present illness. All other systems are reviewed and negative.    PHYSICAL EXAM: VS:  BP 120/80 mmHg  Pulse 74  Ht 5\' 6"  (1.676 m)  Wt 156 lb (70.761 kg)  BMI 25.19 kg/m2  SpO2 98% , BMI Body mass index is 25.19 kg/(m^2). GEN: Well nourished, well developed, female in no acute distress HEENT: normal for age  Neck: no JVD, no carotid bruit, no masses Cardiac: RRR; 2/6 murmur, no rubs, or gallops Respiratory:  clear to auscultation bilaterally, normal work of breathing GI: soft, nontender, nondistended, + BS MS: no deformity or atrophy; no edema; distal pulses are 2+ in all 4 extremities  Skin: warm and dry, no rash Neuro:  Strength and sensation are intact Psych:  euthymic mood, full affect   EKG:  EKG is not ordered today.  Recent Labs: 09/19/2014: ALT 11 05/12/2015: BUN 13; Creatinine, Ser 0.80; Hemoglobin 14.0; Platelets 241; Potassium 4.1; Sodium 136; TSH 1.879    Lipid Panel    Component Value Date/Time   CHOL 205* 05/12/2015 2015   TRIG 64 05/12/2015 2015   HDL 47 05/12/2015 2015   CHOLHDL 4.4 05/12/2015 2015   VLDL 13 05/12/2015 2015   LDLCALC 145* 05/12/2015 2015     Wt Readings from Last 3 Encounters:  05/31/15 156 lb (70.761 kg)  05/14/15 152 lb 3.2 oz (69.037 kg)  09/19/14 140 lb 6.9 oz (63.7 kg)     Other studies Reviewed: Additional studies/ records that were reviewed today include: Hospital records.  ASSESSMENT AND PLAN:  1.  Chest pain: She has had no additional episodes of chest pain. She was reassured that she did not have a heart  attack, her stress test did not show any problems during the stress portion and her heart muscle function is normal. With the normal Myoview, we will leave management of her cardiac risk factors and PVCs to her primary care physician. She is to follow-up with cardiology when necessary  2. Hyperlipidemia: She is encouraged to make dietary changes to decrease her LDL.  3. Palpitations, PVCs on telemetry: Heart skips at times but there do not seem to be any longer runs and she is having no problems with presyncope.   We discussed the risk versus benefits of the metoprolol. The side effects and risks of the medications seem to be outweighing the benefits. Therefore, we will discontinue it.   Regarding changing her to verapamil, since she is already on amlodipine, she is to discuss this with her primary care physician. As her blood pressure is well controlled, I will not change the amlodipine to verapamil, but if her palpitations he come more bothersome, her primary care physician is welcome to make the switch.   Current medicines are reviewed at length with the patient today.  The patient has concerns regarding medicines. All questions were answered  The following changes have been made:  DC'd metoprolol  Labs/ tests ordered today include:  No orders of the defined types were placed in this encounter.     Disposition:   FU with Dr. Martinique when necessary  Signed, Lenoard Aden  05/31/2015 5:26 PM    [facility], [place], [place]  [address] Phone: [phone]; Fax: [phone]

## 2015-05-31 NOTE — Patient Instructions (Signed)
Your physician has recommended you make the following change in your medication: STOP THE METOPROLOL SUCC ER 25 mg.   Your physicians assistant recommends that you schedule a follow-up appointment as needed.  Your physicians assistant recommends that you schedule a follow-up appointment with your primary care doctor in 2 weeks.

## 2015-06-27 DIAGNOSIS — R071 Chest pain on breathing: Secondary | ICD-10-CM | POA: Insufficient documentation

## 2018-11-07 ENCOUNTER — Encounter (HOSPITAL_COMMUNITY): Payer: Self-pay | Admitting: Emergency Medicine

## 2018-11-07 ENCOUNTER — Emergency Department (HOSPITAL_COMMUNITY)
Admission: EM | Admit: 2018-11-07 | Discharge: 2018-11-07 | Disposition: A | Discharge status: Home / Self Care | Payer: Medicare HMO | Attending: Emergency Medicine | Admitting: Emergency Medicine

## 2018-11-07 ENCOUNTER — Other Ambulatory Visit: Payer: Self-pay

## 2018-11-07 DIAGNOSIS — F17210 Nicotine dependence, cigarettes, uncomplicated: Secondary | ICD-10-CM | POA: Diagnosis not present

## 2018-11-07 DIAGNOSIS — Z79899 Other long term (current) drug therapy: Secondary | ICD-10-CM | POA: Diagnosis not present

## 2018-11-07 DIAGNOSIS — I10 Essential (primary) hypertension: Secondary | ICD-10-CM | POA: Diagnosis not present

## 2018-11-07 DIAGNOSIS — Z7982 Long term (current) use of aspirin: Secondary | ICD-10-CM | POA: Insufficient documentation

## 2018-11-07 DIAGNOSIS — K2900 Acute gastritis without bleeding: Secondary | ICD-10-CM | POA: Insufficient documentation

## 2018-11-07 DIAGNOSIS — R109 Unspecified abdominal pain: Secondary | ICD-10-CM | POA: Diagnosis present

## 2018-11-07 LAB — COMPREHENSIVE METABOLIC PANEL
ALT: 15 U/L (ref 0–44)
AST: 25 U/L (ref 15–41)
Albumin: 4.3 g/dL (ref 3.5–5.0)
Alkaline Phosphatase: 64 U/L (ref 38–126)
Anion gap: 11 (ref 5–15)
BUN: 12 mg/dL (ref 8–23)
CO2: 20 mmol/L — ABNORMAL LOW (ref 22–32)
Calcium: 9.6 mg/dL (ref 8.9–10.3)
Chloride: 107 mmol/L (ref 98–111)
Creatinine, Ser: 0.82 mg/dL (ref 0.44–1.00)
GFR calc Af Amer: >60 mL/min (ref >60)
GFR calc non Af Amer: >60 mL/min (ref >60)
Glucose, Bld: 87 mg/dL (ref 70–99)
Potassium: 5.6 mmol/L — ABNORMAL HIGH (ref 3.5–5.1)
Sodium: 138 mmol/L (ref 135–145)
Total Bilirubin: 1.1 mg/dL (ref 0.3–1.2)
Total Protein: 7.9 g/dL (ref 6.5–8.1)

## 2018-11-07 LAB — URINALYSIS, ROUTINE W REFLEX MICROSCOPIC
Bilirubin Urine: NEGATIVE
Glucose, UA: NEGATIVE mg/dL
Hgb urine dipstick: NEGATIVE
Ketones, ur: 5 mg/dL — AB
Leukocytes,Ua: NEGATIVE
Nitrite: NEGATIVE
Protein, ur: NEGATIVE mg/dL
Specific Gravity, Urine: 1.01 (ref 1.005–1.030)
pH: 6 (ref 5.0–8.0)

## 2018-11-07 LAB — CBC WITH DIFFERENTIAL/PLATELET
Abs Immature Granulocytes: 0.02 10*3/uL (ref 0.00–0.07)
Basophils Absolute: 0.1 10*3/uL (ref 0.0–0.1)
Basophils Relative: 1 %
Eosinophils Absolute: 0.1 10*3/uL (ref 0.0–0.5)
Eosinophils Relative: 1 %
HCT: 42.3 % (ref 36.0–46.0)
Hemoglobin: 13.3 g/dL (ref 12.0–15.0)
Immature Granulocytes: 0 %
Lymphocytes Relative: 27 %
Lymphs Abs: 2.3 10*3/uL (ref 0.7–4.0)
MCH: 26.1 pg (ref 26.0–34.0)
MCHC: 31.4 g/dL (ref 30.0–36.0)
MCV: 83.1 fL (ref 80.0–100.0)
Monocytes Absolute: 0.4 10*3/uL (ref 0.1–1.0)
Monocytes Relative: 5 %
Neutro Abs: 5.5 10*3/uL (ref 1.7–7.7)
Neutrophils Relative %: 66 %
Platelets: 235 10*3/uL (ref 150–400)
RBC: 5.09 MIL/uL (ref 3.87–5.11)
RDW: 15.5 % (ref 11.5–15.5)
WBC: 8.3 10*3/uL (ref 4.0–10.5)
nRBC: 0 % (ref 0.0–0.2)

## 2018-11-07 LAB — LIPASE, BLOOD: Lipase: 27 U/L (ref 11–51)

## 2018-11-07 MED ORDER — LIDOCAINE VISCOUS HCL 2 % MT SOLN
15.0000 mL | Freq: Once | OROMUCOSAL | Status: AC
  Administered 2018-11-07: 15 mL via ORAL
  Filled 2018-11-07: qty 15

## 2018-11-07 MED ORDER — ALUM & MAG HYDROXIDE-SIMETH 200-200-20 MG/5ML PO SUSP
30.0000 mL | Freq: Once | ORAL | Status: AC
  Administered 2018-11-07: 30 mL via ORAL
  Filled 2018-11-07: qty 30

## 2018-11-07 MED ORDER — SODIUM CHLORIDE 0.9 % IV BOLUS
1000.0000 mL | Freq: Once | INTRAVENOUS | Status: AC
  Administered 2018-11-07: 1000 mL via INTRAVENOUS

## 2018-11-07 MED ORDER — SUCRALFATE 1 GM/10ML PO SUSP: 1.0000 g | Freq: Three times a day (TID) | ORAL | 0 refills | Status: AC

## 2018-11-07 MED ORDER — OMEPRAZOLE 20 MG PO CPDR: 20.0000 mg | DELAYED_RELEASE_CAPSULE | Freq: Every day | ORAL | 1 refills | Status: AC

## 2018-11-07 MED ORDER — LIDOCAINE VISCOUS HCL 2 % MT SOLN: 15.0000 mL | OROMUCOSAL | 0 refills | Status: AC | PRN

## 2018-11-07 MED ORDER — AMLODIPINE BESYLATE 10 MG PO TABS: 10.0000 mg | ORAL_TABLET | Freq: Every day | ORAL | 2 refills | Status: AC

## 2018-11-07 NOTE — ED Provider Notes (Signed)
[facility] DEPT Provider Note   CSN: [id number] Arrival date & time: 11/07/18  1650    History   Chief Complaint Chief Complaint  Patient presents with  . Abdominal Pain  . sent from UC    HPI Beth Pineda is a 68 y.o. female.     The history is provided by the patient.  Abdominal Pain  Pain location:  Epigastric and periumbilical Pain quality: aching, cramping and gnawing   Pain radiates to:  Does not radiate Pain severity:  Moderate Onset quality:  Gradual Duration:  3 hours Timing:  Constant Progression:  Waxing and waning Chronicity:  Recurrent Context: not alcohol use, not diet changes, not medication withdrawal, not previous surgeries, not recent illness, not recent travel, not retching, not sick contacts, not suspicious food intake and not trauma   Context comment:  Patient states she has been under a lot of stress lately but noticed on Saturday epigastric pain that has been persistent since that time.  In the past she has had a peptic ulcer approximately 1 year ago and states it feels similar to that. Relieved by: some Improvement with Carafate, ginger tea and other homeopathic remedies. Exacerbated by: Lying down seems to make it the worse but also some changes in position.  She is also not had much of an appetite but does not feel like the soups and broth she has been eating has made it worse. Ineffective treatments:  Antacids Associated symptoms: anorexia and nausea   Associated symptoms: no chest pain, no cough, no diarrhea, no dysuria, no fever, no hematuria, no shortness of breath and no vomiting   Associated symptoms comment:  Patient states she is chronically constipated and only has 2 bowel movements a week and that is when she takes Dulcolax.  Last bowel movement was on Thursday and was normal.  She did not take a laxative today because she was not sure if it would make her stomach hurt worse.  She denies any NSAID or aspirin  use.  She does report having an ulcer found on endoscopy 1 year ago and took antibiotics and Zantac.  She has been on Pepcid for the last 8 months.  Last antibiotics were about 4 months ago for UTI.  She has had no recent medication changes. Risk factors: no alcohol abuse, no aspirin use, has not had multiple surgeries, no NSAID use and not obese     Past Medical History:  Diagnosis Date  . Hyperlipidemia    T chol 205, LDL 145 04/2015  . Hypertension   . Kidney cysts   . Tobacco use     Patient Active Problem List   Diagnosis Date Noted  . Chest pain on breathing   . PVC's (premature ventricular contractions) 05/14/2015  . Pain in the chest   . Essential hypertension   . Tobacco abuse   . Chest pain 05/12/2015  . Sepsis secondary to UTI ([place]) 09/19/2014  . Pyelonephritis, acute 09/19/2014    Past Surgical History:  Procedure Laterality Date  . ABDOMINAL HYSTERECTOMY    . CESAREAN SECTION    . HERNIA REPAIR    . SHOULDER SURGERY       OB History   No obstetric history on file.      Home Medications    Prior to Admission medications   Medication Sig Start Date End Date Taking? Authorizing Provider  amLODipine (NORVASC) 10 MG tablet Take 10 mg by mouth daily.    [provider]  aspirin 81 MG tablet Take 81 mg by mouth daily.    [provider]  COENZYME Q-10 PO Take 50 mg by mouth daily.    [provider]    Family History No family history on file.  Social History Social History   Tobacco Use  . Smoking status: Current Every Day Smoker    Packs/day: 0.50    Types: Cigarettes  . Smokeless tobacco: Never Used  Substance Use Topics  . Alcohol use: Yes    Alcohol/week: 2.0 standard drinks    Types: 2 Glasses of wine per week  . Drug use: No     Allergies   Lisinopril and Ciprofloxacin   Review of Systems Review of Systems  Constitutional: Negative for fever.  Respiratory: Negative for cough and shortness of breath.    Cardiovascular: Negative for chest pain.  Gastrointestinal: Positive for abdominal pain, anorexia and nausea. Negative for diarrhea and vomiting.  Genitourinary: Negative for dysuria and hematuria.  All other systems reviewed and are negative.    Physical Exam Updated Vital Signs BP (!) 134/107 (BP Location: Left Arm)   Pulse (!) 102   Temp 98.5 F (36.9 C) (Oral)   Resp 16   SpO2 100%   Physical Exam Vitals signs and nursing note reviewed.  Constitutional:      General: She is in acute distress.     Appearance: She is well-developed.  HENT:     Head: Normocephalic and atraumatic.     Mouth/Throat:     Comments: Dry mucous membranes Eyes:     Pupils: Pupils are equal, round, and reactive to light.  Cardiovascular:     Rate and Rhythm: Regular rhythm. Tachycardia present.     Heart sounds: Normal heart sounds. No murmur. No friction rub.  Pulmonary:     Effort: Pulmonary effort is normal.     Breath sounds: Normal breath sounds. No wheezing or rales.  Abdominal:     General: Bowel sounds are normal. There is no distension.     Palpations: Abdomen is soft.     Tenderness: There is abdominal tenderness in the epigastric area and left upper quadrant. There is no guarding or rebound. Negative signs include Murphy's sign and McBurney's sign.     Hernia: No hernia is present.  Musculoskeletal: Normal range of motion.        General: No tenderness.     Comments: No edema  Skin:    General: Skin is warm and dry.     Findings: No rash.  Neurological:     Mental Status: She is alert and oriented to person, place, and time.     Cranial Nerves: No cranial nerve deficit.  Psychiatric:        Behavior: Behavior normal.      ED Treatments / Results  Labs (all labs ordered are listed, but only abnormal results are displayed) Labs Reviewed  COMPREHENSIVE METABOLIC PANEL - Abnormal; Notable for the following components:      Result Value   Potassium 5.6 (*)    CO2 20 (*)     All other components within normal limits  URINALYSIS, ROUTINE W REFLEX MICROSCOPIC - Abnormal; Notable for the following components:   Ketones, ur 5 (*)    All other components within normal limits  CBC WITH DIFFERENTIAL/PLATELET  LIPASE, BLOOD    EKG EKG Interpretation  Date/Time:  Monday November 07 2018 18:05:25 EDT Ventricular Rate:  79 PR Interval:    QRS Duration:  86 QT Interval:  386 QTC Calculation: 443 R Axis:   30 Text Interpretation:  Sinus rhythm Consider left atrial enlargement Premature ventricular complexes RESOLVED SINCE PREVIOUS Confirmed by Blanchie Dessert [phone]) on 11/07/2018 6:08:43 PM   Radiology No results found.  Procedures Procedures (including critical care time)  Medications Ordered in ED Medications  sodium chloride 0.9 % bolus 1,000 mL (has no administration in time range)  alum & mag hydroxide-simeth (MAALOX/MYLANTA) 200-200-20 MG/5ML suspension 30 mL (has no administration in time range)    And  lidocaine (XYLOCAINE) 2 % viscous mouth solution 15 mL (has no administration in time range)     Initial Impression / Assessment and Plan / ED Course  I have reviewed the triage vital signs and the nursing notes.  Pertinent labs & imaging results that were available during my care of the patient were reviewed by me and considered in my medical decision making (see chart for details).        Patient is a 68 year old female presenting today with abdominal pain since Saturday that is in the epigastric region associated with bloating, early satiety and seems to be worse with lying down.  He has had no vomiting or change in her bowel movements.  She has had no fever or upper respiratory symptoms.  No prior history of heart disease but does have prior history of dysrhythmia.  Patient is well-appearing on exam but does have tenderness in the epigastric and left upper quadrant area.  She is not peritoneal at this time.  Concern for possible return of her  ulcer which she has history of versus gastritis versus pancreatitis.  Lower suspicion for cholecystitis at this time.  Patient has history of frequent UTIs but does not have specific urinary complaints today.  Low concern for cardiac or lung pathology at this time.  CBC, CMP, lipase, UA, EKG pending.  Patient given GI cocktail and IV fluids.  8:19 PM Patient's labs and EKG today with no acute findings.  Patient feels much better after GI cocktail.  This time will treat for acute gastritis.  She was placed on omeprazole, Carafate and given some viscous lidocaine for pain.  She also was given a refill for her blood pressure medication because she was not sure when she could see her doctor next.  She was strongly advised to follow-up with GI in 2 weeks if she is not feeling better and given strict return precautions.  Final Clinical Impressions(s) / ED Diagnoses   Final diagnoses:  Acute gastritis without hemorrhage, unspecified gastritis type    ED Discharge Orders         Ordered    amLODipine (NORVASC) 10 MG tablet  Daily     11/07/18 2013    omeprazole (PRILOSEC) 20 MG capsule  Daily     11/07/18 2013    sucralfate (CARAFATE) 1 GM/10ML suspension  3 times daily with meals & bedtime     11/07/18 2013    lidocaine (XYLOCAINE) 2 % solution  As needed     11/07/18 2013           Blanchie Dessert, MD 11/07/18 2020

## 2018-11-07 NOTE — Discharge Instructions (Signed)
If you develop fever, vomiting, worsening pain or other concerns please return to the emergency room or call your doctor sooner.  It would be okay for you to take a laxative as needed.

## 2018-11-07 NOTE — ED Triage Notes (Signed)
Pt sent from UC for epigastric pain since Saturday that has been constant with gas. Reports is worse at night when laying down. Hx ulcer.

## 2018-12-07 ENCOUNTER — Other Ambulatory Visit: Payer: Self-pay | Admitting: Physician Assistant

## 2018-12-07 DIAGNOSIS — Z1231 Encounter for screening mammogram for malignant neoplasm of breast: Secondary | ICD-10-CM

## 2019-10-20 ENCOUNTER — Ambulatory Visit: Payer: Medicare HMO | Attending: Internal Medicine

## 2019-10-20 DIAGNOSIS — Z23 Encounter for immunization: Secondary | ICD-10-CM

## 2019-10-20 NOTE — Progress Notes (Signed)
   Covid-19 Vaccination Clinic  Name:  Beth Pineda    MRN: [id number] DOB: [birth year]  10/20/2019  [name] was observed post Covid-19 immunization for 15 minutes without incident. She was provided with Vaccine Information Sheet and instruction to access the V-Safe system.   [name] was instructed to call 911 with any severe reactions post vaccine: Marland Kitchen Difficulty breathing  . Swelling of face and throat  . A fast heartbeat  . A bad rash all over body  . Dizziness and weakness   Immunizations Administered    Name Date Dose VIS Date Route   Pfizer COVID-19 Vaccine 10/20/2019  3:20 PM 0.3 mL 06/30/2019 Intramuscular   Manufacturer: [employer], [employer]   Lot: [id number]   [place]: [id number]

## 2019-11-14 ENCOUNTER — Ambulatory Visit: Payer: Medicare HMO

## 2021-07-02 ENCOUNTER — Other Ambulatory Visit: Payer: Self-pay | Admitting: Urology

## 2021-09-29 NOTE — Patient Instructions (Addendum)
DUE TO COVID-19 ONLY ONE VISITOR  (aged 71 and older)  IS ALLOWED TO COME WITH YOU AND STAY IN THE WAITING ROOM ONLY DURING PRE OP AND PROCEDURE.   ?**NO VISITORS ARE ALLOWED IN THE SHORT STAY AREA OR RECOVERY ROOM!!** ? ?IF YOU WILL BE ADMITTED INTO THE HOSPITAL YOU ARE ALLOWED ONLY TWO SUPPORT PEOPLE DURING VISITATION HOURS ONLY (7 AM -8PM)   ?The support person(s) must pass our screening, gel in and out, and wear a mask at all times, including in the patient?s room. ?Patients must also wear a mask when staff or their support person are in the room. ?Visitors GUEST BADGE MUST BE WORN VISIBLY  ?One adult visitor may remain with you overnight and MUST be in the room by 8 P.M. ?  ? ? COVID SWAB TESTING MUST BE COMPLETED ON:  10/03/21 ? ?  (*ARRIVE AT YOUR APPOINTMENT TIME STAFF IS NOT HERE BEFORE 8AM!!!*) ? ?  Site: [facility] [place] [facility] [place] [place] ?Enter: Main Entrance have a seat in the waiting area to the right of main entrance (DO NOT CHECK IN AT ADMITTING!!!!!) ?Dial: [phone] to alert staff you have arrived ? ?You are not required to quarantine, however you are required to wear a well-fitted mask when you are out and around people not in your household.  ?Hand Hygiene often ?Do NOT share personal items ?Notify your provider if you are in close contact with someone who has COVID or you develop fever 100.4 or greater, new onset of sneezing, cough, sore throat, shortness of breath or body aches. ? ?[place] [address] [place], [address], must go inside of the hospital, NOT A DRIVE THRU!  (Must self quarantine after testing. Follow instructions on handout.) ?     ? Your procedure is scheduled on: 10/07/21 ? ? Report to [facility] Main Entrance ? ?  Report to Short stay at : 5:15 AM ? ? Call this number if you have problems the morning of surgery [phone] ? ? Do not eat food :After Midnight. ? ? After Midnight you may have the  following liquids until: 4:30 AM  DAY OF SURGERY ? ?Water ?Black Coffee (sugar ok, NO MILK/CREAM OR CREAMERS)  ?Tea (sugar ok, NO MILK/CREAM OR CREAMERS) regular and decaf                             ?Plain Jell-O (NO RED)                                           ?Fruit ices (not with fruit pulp, NO RED)                                     ?Popsicles (NO RED)                                                                  ?Juice: apple, WHITE grape, WHITE cranberry ?Sports drinks like Gatorade (NO RED) ?Clear broth(vegetable,chicken,beef) ? ?  Oral Hygiene is also important to reduce your risk of infection.                                    ?Remember - BRUSH YOUR TEETH THE MORNING OF SURGERY WITH YOUR REGULAR TOOTHPASTE ? ? Do NOT smoke after Midnight ? ? Take these medicines the morning of surgery with A SIP OF WATER: amlodipine,famotidine. ? ?DO NOT TAKE ANY ORAL DIABETIC MEDICATIONS DAY OF YOUR SURGERY ? ?Bring CPAP mask and tubing day of surgery. ?                  ?           You may not have any metal on your body including hair pins, jewelry, and body piercing ? ?           Do not wear make-up, lotions, powders, perfumes/cologne, or deodorant ? ?Do not wear nail polish including gel and S&S, artificial/acrylic nails, or any other type of covering on natural nails including finger and toenails. If you have artificial nails, gel coating, etc. that needs to be removed by a nail salon please have this removed prior to surgery or surgery may need to be canceled/ delayed if the surgeon/ anesthesia feels like they are unable to be safely monitored.  ? ?Do not shave  48 hours prior to surgery.  ? ? Do not bring valuables to the hospital. [facility] NOT ?            RESPONSIBLE   FOR VALUABLES. ? ? Contacts, dentures or bridgework may not be worn into surgery. ? ? Bring small overnight bag day of surgery. ?  ? Patients discharged on the day of surgery will not be allowed to drive home.  Someone NEEDS to stay with  you for the first 24 hours after anesthesia. ? ? Special Instructions: Bring a copy of your healthcare power of attorney and living will documents         the day of surgery if you haven't scanned them before. ? ?            Please read over the following fact sheets you were given: IF [place] [phone] ? ?   [facility] - Preparing for Surgery ?Before surgery, you can play an important role.  Because skin is not sterile, your skin needs to be as free of germs as possible.  You can reduce the number of germs on your skin by washing with CHG (chlorahexidine gluconate) soap before surgery.  CHG is an antiseptic cleaner which kills germs and bonds with the skin to continue killing germs even after washing. ?Please DO NOT use if you have an allergy to CHG or antibacterial soaps.  If your skin becomes reddened/irritated stop using the CHG and inform your nurse when you arrive at Short Stay. ?Do not shave (including legs and underarms) for at least 48 hours prior to the first CHG shower.  You may shave your face/neck. ?Please follow these instructions carefully: ? 1.  Shower with CHG Soap the night before surgery and the  morning of Surgery. ? 2.  If you choose to wash your hair, wash your hair first as usual with your  normal  shampoo. ? 3.  After you shampoo, rinse your hair and body thoroughly to remove the  shampoo.  4.  Use CHG as you would any other liquid soap.  You can apply chg directly  to the skin and wash  ?                     Gently with a scrungie or clean washcloth. ? 5.  Apply the CHG Soap to your body ONLY FROM THE NECK DOWN.   Do not use on face/ open      ?                     Wound or open sores. Avoid contact with eyes, ears mouth and genitals (private parts).  ?                     Production manager,  Genitals (private parts) with your normal soap. ?            6.  Wash thoroughly, paying special attention to the area where  your surgery  will be performed. ? 7.  Thoroughly rinse your body with warm water from the neck down. ? 8.  DO NOT shower/wash with your normal soap after using and rinsing off  the CHG Soap. ?               9.  Pat yourself dry with a clean towel. ?           10.  Wear clean pajamas. ?           11.  Place clean sheets on your bed the night of your first shower and do not  sleep with pets. ?Day of Surgery : ?Do not apply any lotions/deodorants the morning of surgery.  Please wear clean clothes to the hospital/surgery center. ? ?FAILURE TO FOLLOW THESE INSTRUCTIONS MAY RESULT IN THE CANCELLATION OF YOUR SURGERY ?PATIENT SIGNATURE_________________________________ ? ?NURSE SIGNATURE__________________________________ ? ?________________________________________________________________________  ?

## 2021-09-30 ENCOUNTER — Encounter (HOSPITAL_COMMUNITY): Payer: Self-pay

## 2021-09-30 ENCOUNTER — Encounter (HOSPITAL_COMMUNITY)
Admission: RE | Admit: 2021-09-30 | Discharge: 2021-09-30 | Disposition: A | Discharge status: Home / Self Care | Payer: Medicare HMO | Source: Ambulatory Visit | Attending: Urology | Admitting: Urology

## 2021-09-30 ENCOUNTER — Other Ambulatory Visit: Payer: Self-pay

## 2021-09-30 DIAGNOSIS — Z01812 Encounter for preprocedural laboratory examination: Secondary | ICD-10-CM | POA: Diagnosis present

## 2021-09-30 HISTORY — DX: Anemia, unspecified: D64.9

## 2021-09-30 HISTORY — DX: Depression, unspecified: F32.A

## 2021-09-30 HISTORY — DX: Anxiety disorder, unspecified: F41.9

## 2021-09-30 HISTORY — DX: Cardiac arrhythmia, unspecified: I49.9

## 2021-09-30 HISTORY — DX: Tachycardia, unspecified: R00.0

## 2021-09-30 LAB — CBC
HCT: 44 % (ref 36.0–46.0)
Hemoglobin: 13.7 g/dL (ref 12.0–15.0)
MCH: 25.8 pg — ABNORMAL LOW (ref 26.0–34.0)
MCHC: 31.1 g/dL (ref 30.0–36.0)
MCV: 82.9 fL (ref 80.0–100.0)
Platelets: 314 10*3/uL (ref 150–400)
RBC: 5.31 MIL/uL — ABNORMAL HIGH (ref 3.87–5.11)
RDW: 15.5 % (ref 11.5–15.5)
WBC: 8.5 10*3/uL (ref 4.0–10.5)
nRBC: 0 % (ref 0.0–0.2)

## 2021-09-30 LAB — BASIC METABOLIC PANEL
Anion gap: 8 (ref 5–15)
BUN: 12 mg/dL (ref 8–23)
CO2: 29 mmol/L (ref 22–32)
Calcium: 9.3 mg/dL (ref 8.9–10.3)
Chloride: 102 mmol/L (ref 98–111)
Creatinine, Ser: 0.59 mg/dL (ref 0.44–1.00)
GFR, Estimated: >60 mL/min (ref >60)
Glucose, Bld: 86 mg/dL (ref 70–99)
Potassium: 4.1 mmol/L (ref 3.5–5.1)
Sodium: 139 mmol/L (ref 135–145)

## 2021-09-30 NOTE — Progress Notes (Addendum)
For Short Stay: ?[facility] appointment date: N/A ?Date of COVID positive in last 90 days: N/A ?COVID Vaccine: Pfizer x 3. 07/16/20 ?Bowel Prep reminder: Reviewed ? ? ?For Anesthesia: ?PCP - Dr. Vira Browns ?Cardiologist - Dr. Claudie Leach ? ?Chest x-ray -  ?EKG - Requested from [facility] medical.: Chart 12/12/20 ?Stress Test -  ?ECHO - 09/22/21: Chart ?Cardiac Cath -  ?Pacemaker/ICD device last checked: ?Pacemaker orders received: ?Device Rep notified: ? ?Spinal Cord Stimulator: ? ?Sleep Study -  ?CPAP -  ? ?Fasting Blood Sugar -  ?Checks Blood Sugar _____ times a day ?Date and result of last Hgb A1c- ? ?Blood Thinner Instructions: ?Aspirin Instructions: ?Last Dose: ? ?Activity level: Can go up a flight of stairs and activities of daily living without stopping and without chest pain and/or shortness of breath ?  Able to exercise without chest pain and/or shortness of breath ?  Unable to go up a flight of stairs without chest pain and/or shortness of breath ?   ? ?Anesthesia review: HTN,Tachycardia,irregular HR. ? ?Patient denies shortness of breath, fever, cough and chest pain at PAT appointment ? ? ?Patient verbalized understanding of instructions that were given to them at the PAT appointment. Patient was also instructed that they will need to review over the PAT instructions again at home before surgery.  ?

## 2021-10-01 NOTE — Progress Notes (Signed)
Lab. Report: Potassium: 5.6 ?

## 2021-10-06 NOTE — Anesthesia Preprocedure Evaluation (Addendum)
Anesthesia Evaluation  ?Patient identified by MRN, date of birth, ID band ?Patient awake ? ? ? ?Reviewed: ?Allergy & Precautions, NPO status , Patient's Chart, lab work & pertinent test results ? ?Airway ?Mallampati: II ? ?TM Distance: >3 FB ?Neck ROM: Full ? ? ? Dental ?no notable dental hx. ?(+) Missing, Dental Advisory Given,  ?  ?Pulmonary ?former smoker,  ?  ?Pulmonary exam normal ?breath sounds clear to auscultation ? ? ? ? ? ? Cardiovascular ?hypertension, Normal cardiovascular exam ?Rhythm:Irregular Rate:Tachycardia ? ? ?  ?Neuro/Psych ?Anxiety   ? GI/Hepatic ?  ?Endo/Other  ?negative endocrine ROS ? Renal/GU ?Renal diseaseRenal Mass ?Lab Results ?     Component                Value               Date                 ?     CREATININE               0.59                09/30/2021           ?     BUN                      12                  09/30/2021           ?     NA                       139                 09/30/2021           ?     K                        4.1                 09/30/2021           ?     CL                       102                 09/30/2021           ?     CO2                      29                  09/30/2021           ?  ? ?  ?Musculoskeletal ?negative musculoskeletal ROS ?(+)  ? Abdominal ?  ?Peds ? Hematology ? ?(+) Blood dyscrasia, anemia , Lab Results ?     Component                Value               Date                 ?     WBC                      8.5  09/30/2021           ?     HGB                      13.7                09/30/2021           ?     HCT                      44.0                09/30/2021           ?     MCV                      82.9                09/30/2021           ?     PLT                      314                 09/30/2021           ?   ?Anesthesia Other Findings ?All: cipro Nitrofurantoin, Clonidine, lisinopril ? Reproductive/Obstetrics ? ?  ? ? ? ? ? ? ? ? ? ? ? ? ? ?  ?  ? ? ? ? ? ? ? ?Anesthesia  Physical ?Anesthesia Plan ? ?ASA: 3 ? ?Anesthesia Plan: General  ? ?Post-op Pain Management: Lidocaine infusion*, Ofirmev IV (intra-op)* and Ketamine IV*  ? ?Induction: Intravenous ? ?PONV Risk Score and Plan: 3 and Midazolam, Ondansetron, Treatment may vary due to age or medical condition and Dexamethasone ? ?Airway Management Planned: Oral ETT ? ?Additional Equipment:  ? ?Intra-op Plan:  ? ?Post-operative Plan: Extubation in OR ? ?Informed Consent: I have reviewed the patients History and Physical, chart, labs and discussed the procedure including the risks, benefits and alternatives for the proposed anesthesia with the patient or authorized representative who has indicated his/her understanding and acceptance.  ? ? ? ?Dental advisory given ? ?Plan Discussed with: CRNA and Anesthesiologist ? ?Anesthesia Plan Comments:   ? ? ? ? ? ?Anesthesia Quick Evaluation ? ?

## 2021-10-06 NOTE — H&P (Signed)
Urology Preoperative H&P  ? ?Chief Complaint: Right renal mass ? ?History of Present Illness: Beth Pineda is a 71 y.o. female with a multilocular right renal mass.  CT report from 01/2021 noted a 10 cm complex right peripelvic cystic lesion (the mass measured 8.3 x 6.1 cm on CT from 2010) and a normal left kidney. She denies interval episodes of flank pain, UTI, dysuria, hematuria, nausea or early satiety.  ? ?Past Medical History:  ?Diagnosis Date  ? Anemia   ? Anxiety   ? Arrhythmia   ? Depression   ? Hyperlipidemia   ? T chol 205, LDL 145 04/2015  ? Hypertension   ? Kidney cysts   ? Tachycardia   ? Tobacco use   ? ? ?Past Surgical History:  ?Procedure Laterality Date  ? ABDOMINAL HYSTERECTOMY    ? CESAREAN SECTION    ? HERNIA REPAIR    ? SHOULDER SURGERY    ? TONSILLECTOMY    ? ? ?Allergies:  ?Allergies  ?Allergen Reactions  ? Lisinopril Shortness Of Breath and Nausea And Vomiting  ?  Made patient feel real bad  ? Ciprofloxacin Other (See Comments)  ?  Shoulder locked up  ? Nitrofurantoin Other (See Comments)  ?  Other Reaction: Other reaction  ? Clonidine Derivatives Rash  ? ? ?No family history on file. ? ?Social History:  reports that she quit smoking about 16 months ago. Her smoking use included cigarettes. She has a 20.00 pack-year smoking history. She has never used smokeless tobacco. She reports current alcohol use of about 2.0 standard drinks per week. She reports current drug use. Drug: Marijuana. ? ?ROS: ?A complete review of systems was performed.  All systems are negative except for pertinent findings as noted. ? ?Physical Exam:  ?Vital signs in last 24 hours: ?  ?Constitutional:  Alert and oriented, No acute distress ?Cardiovascular: Regular rate and rhythm, No JVD ?Respiratory: Normal respiratory effort, Lungs clear bilaterally ?GI: Abdomen is soft, nontender, nondistended, no abdominal masses ?GU: No CVA tenderness ?Lymphatic: No lymphadenopathy ?Neurologic: Grossly intact, no focal  deficits ?Psychiatric: Normal mood and affect ? ?Laboratory Data:  ?No results for input(s): WBC, HGB, HCT, PLT in the last 72 hours. ? ?No results for input(s): NA, K, CL, GLUCOSE, BUN, CALCIUM, CREATININE in the last 72 hours. ? ?Invalid input(s): CO3 ? ? ?No results found for this or any previous visit (from the past 24 hour(s)). ?No results found for this or any previous visit (from the past 240 hour(s)). ? ?Renal Function: ?Recent Labs  ?  09/30/21 ?7616  ?CREATININE 0.59  ? ?Estimated Creatinine Clearance: 63.6 mL/min (by C-G formula based on SCr of 0.59 mg/dL). ? ?Radiologic Imaging: ?No results found. ? ?I independently reviewed the above imaging studies. ? ?Assessment and Plan ?Beth Pineda is a 71 y.o. female with complex cystic lesion involving the right kidney with features concerning for RCC.  ? ?-I reviewed imaging results and films with the patient personally. We discussed that the mass in question has features concerning for malignancy. I explained the natural history of presumed renal cell carcinoma. I reviewed the AUA guidelines for evaluation and treatment of renal masses. The options of active surveillance, in situ tumor ablation, partial and radical nephrectomy was discussed. The risks of robotic laparoscopic RIGHT radical nephrectomy were discussed in detail including but not limited to: negative pathology, open conversion, infection of the skin/abdominal cavity, VTE, MI/CVA, lymphatic leak, injury to adjacent solid/hollow viscus organs, bleeding requiring a blood  transfusion, catastrophic bleeding, hernia formation and other imponderables. The patient voices understanding and wishes to proceed.  ? ?Ellison Hughs, MD ?10/06/2021, 3:49 PM  ?[facility] ?Pager: [phone] ? ?

## 2021-10-07 ENCOUNTER — Inpatient Hospital Stay (HOSPITAL_COMMUNITY): Payer: Medicare HMO | Admitting: Anesthesiology

## 2021-10-07 ENCOUNTER — Inpatient Hospital Stay (HOSPITAL_COMMUNITY)
Admission: RE | Admit: 2021-10-07 | Discharge: 2021-10-09 | DRG: 657 | Disposition: A | Discharge status: Home / Self Care | Payer: Medicare HMO | Attending: Urology | Admitting: Urology

## 2021-10-07 ENCOUNTER — Other Ambulatory Visit: Payer: Self-pay

## 2021-10-07 ENCOUNTER — Encounter (HOSPITAL_COMMUNITY)
Admission: RE | Disposition: A | Discharge status: Home / Self Care | Payer: Self-pay | Source: Home / Self Care | Attending: Urology

## 2021-10-07 ENCOUNTER — Inpatient Hospital Stay (HOSPITAL_COMMUNITY): Payer: Medicare HMO | Admitting: Physician Assistant

## 2021-10-07 ENCOUNTER — Encounter (HOSPITAL_COMMUNITY): Payer: Self-pay | Admitting: Urology

## 2021-10-07 DIAGNOSIS — F419 Anxiety disorder, unspecified: Secondary | ICD-10-CM | POA: Diagnosis present

## 2021-10-07 DIAGNOSIS — Z881 Allergy status to other antibiotic agents status: Secondary | ICD-10-CM | POA: Diagnosis not present

## 2021-10-07 DIAGNOSIS — Z888 Allergy status to other drugs, medicaments and biological substances status: Secondary | ICD-10-CM

## 2021-10-07 DIAGNOSIS — F32A Depression, unspecified: Secondary | ICD-10-CM | POA: Diagnosis present

## 2021-10-07 DIAGNOSIS — N2889 Other specified disorders of kidney and ureter: Principal | ICD-10-CM | POA: Diagnosis present

## 2021-10-07 DIAGNOSIS — E785 Hyperlipidemia, unspecified: Secondary | ICD-10-CM | POA: Diagnosis present

## 2021-10-07 DIAGNOSIS — Z87891 Personal history of nicotine dependence: Secondary | ICD-10-CM | POA: Diagnosis not present

## 2021-10-07 DIAGNOSIS — C641 Malignant neoplasm of right kidney, except renal pelvis: Secondary | ICD-10-CM | POA: Diagnosis present

## 2021-10-07 DIAGNOSIS — N281 Cyst of kidney, acquired: Secondary | ICD-10-CM

## 2021-10-07 DIAGNOSIS — I10 Essential (primary) hypertension: Secondary | ICD-10-CM | POA: Diagnosis present

## 2021-10-07 DIAGNOSIS — D62 Acute posthemorrhagic anemia: Secondary | ICD-10-CM | POA: Diagnosis not present

## 2021-10-07 DIAGNOSIS — D649 Anemia, unspecified: Secondary | ICD-10-CM

## 2021-10-07 HISTORY — PX: ROBOT ASSISTED LAPAROSCOPIC NEPHRECTOMY: SHX5140

## 2021-10-07 LAB — TYPE AND SCREEN
ABO/RH(D): O POS
Antibody Screen: NEGATIVE

## 2021-10-07 LAB — HEMOGLOBIN AND HEMATOCRIT, BLOOD
HCT: 42 % (ref 36.0–46.0)
Hemoglobin: 13.2 g/dL (ref 12.0–15.0)

## 2021-10-07 SURGERY — NEPHRECTOMY, RADICAL, ROBOT-ASSISTED, LAPAROSCOPIC, ADULT
Anesthesia: General | Site: Flank | Laterality: Right

## 2021-10-07 MED ORDER — HYDROMORPHONE HCL 1 MG/ML IJ SOLN
0.5000 mg | INTRAMUSCULAR | Status: DC | PRN
  Administered 2021-10-07 (×3): 0.5 mg via INTRAVENOUS
  Administered 2021-10-08: 1 mg via INTRAVENOUS
  Administered 2021-10-08: 0.5 mg via INTRAVENOUS
  Filled 2021-10-07 (×6): qty 1

## 2021-10-07 MED ORDER — LIDOCAINE HCL 2 % IJ SOLN
INTRAMUSCULAR | Status: AC
  Filled 2021-10-07: qty 20

## 2021-10-07 MED ORDER — BUPIVACAINE LIPOSOME 1.3 % IJ SUSP
INTRAMUSCULAR | Status: DC | PRN
  Administered 2021-10-07: 20 mL

## 2021-10-07 MED ORDER — LACTATED RINGERS IR SOLN
Status: DC | PRN
  Administered 2021-10-07: 1000 mL

## 2021-10-07 MED ORDER — OXYCODONE HCL 5 MG/5ML PO SOLN: 5.0000 mg | Freq: Once | ORAL | Status: DC | PRN

## 2021-10-07 MED ORDER — ONDANSETRON HCL 4 MG/2ML IJ SOLN
INTRAMUSCULAR | Status: DC | PRN
  Administered 2021-10-07: 4 mg via INTRAVENOUS

## 2021-10-07 MED ORDER — BUPIVACAINE LIPOSOME 1.3 % IJ SUSP
INTRAMUSCULAR | Status: AC
  Filled 2021-10-07: qty 20

## 2021-10-07 MED ORDER — FENTANYL CITRATE (PF) 250 MCG/5ML IJ SOLN
INTRAMUSCULAR | Status: DC | PRN
  Administered 2021-10-07: 50 ug via INTRAVENOUS
  Administered 2021-10-07 (×2): 25 ug via INTRAVENOUS

## 2021-10-07 MED ORDER — PROPOFOL 10 MG/ML IV BOLUS
INTRAVENOUS | Status: DC | PRN
  Administered 2021-10-07: 120 mg via INTRAVENOUS

## 2021-10-07 MED ORDER — MIDAZOLAM HCL 5 MG/5ML IJ SOLN
INTRAMUSCULAR | Status: DC | PRN
  Administered 2021-10-07 (×2): 1 mg via INTRAVENOUS

## 2021-10-07 MED ORDER — PROPOFOL 10 MG/ML IV BOLUS
INTRAVENOUS | Status: AC
  Filled 2021-10-07: qty 20

## 2021-10-07 MED ORDER — PHENYLEPHRINE 40 MCG/ML (10ML) SYRINGE FOR IV PUSH (FOR BLOOD PRESSURE SUPPORT)
PREFILLED_SYRINGE | INTRAVENOUS | Status: DC | PRN
  Administered 2021-10-07 (×2): 120 ug via INTRAVENOUS
  Administered 2021-10-07: 80 ug via INTRAVENOUS

## 2021-10-07 MED ORDER — LACTATED RINGERS IV SOLN
INTRAVENOUS | Status: DC
  Administered 2021-10-07: 1000 mL via INTRAVENOUS

## 2021-10-07 MED ORDER — CEFAZOLIN SODIUM-DEXTROSE 1-4 GM/50ML-% IV SOLN
1.0000 g | Freq: Three times a day (TID) | INTRAVENOUS | Status: AC
  Administered 2021-10-07 (×2): 1 g via INTRAVENOUS
  Filled 2021-10-07 (×2): qty 50

## 2021-10-07 MED ORDER — PHENYLEPHRINE 40 MCG/ML (10ML) SYRINGE FOR IV PUSH (FOR BLOOD PRESSURE SUPPORT)
PREFILLED_SYRINGE | INTRAVENOUS | Status: AC
  Filled 2021-10-07: qty 10

## 2021-10-07 MED ORDER — MIDAZOLAM HCL 2 MG/2ML IJ SOLN
INTRAMUSCULAR | Status: AC
  Filled 2021-10-07: qty 2

## 2021-10-07 MED ORDER — DOCUSATE SODIUM 100 MG PO CAPS: 100.0000 mg | ORAL_CAPSULE | Freq: Two times a day (BID) | ORAL | Status: AC

## 2021-10-07 MED ORDER — HYDROMORPHONE HCL 1 MG/ML IJ SOLN
INTRAMUSCULAR | Status: AC
  Filled 2021-10-07: qty 2

## 2021-10-07 MED ORDER — DIPHENHYDRAMINE HCL 12.5 MG/5ML PO ELIX: 12.5000 mg | ORAL_SOLUTION | Freq: Four times a day (QID) | ORAL | Status: DC | PRN

## 2021-10-07 MED ORDER — ACETAMINOPHEN 500 MG PO TABS
1000.0000 mg | ORAL_TABLET | Freq: Four times a day (QID) | ORAL | Status: AC
  Administered 2021-10-07 – 2021-10-08 (×4): 1000 mg via ORAL
  Filled 2021-10-07 (×4): qty 2

## 2021-10-07 MED ORDER — OXYCODONE HCL 5 MG PO TABS
5.0000 mg | ORAL_TABLET | ORAL | Status: DC | PRN
  Administered 2021-10-08 – 2021-10-09 (×3): 5 mg via ORAL
  Filled 2021-10-07 (×3): qty 1

## 2021-10-07 MED ORDER — OXYCODONE HCL 5 MG PO TABS: 5.0000 mg | ORAL_TABLET | Freq: Once | ORAL | Status: DC | PRN

## 2021-10-07 MED ORDER — SENNOSIDES-DOCUSATE SODIUM 8.6-50 MG PO TABS
2.0000 | ORAL_TABLET | Freq: Every day | ORAL | Status: DC
  Administered 2021-10-07 – 2021-10-08 (×2): 2 via ORAL
  Filled 2021-10-07 (×2): qty 2

## 2021-10-07 MED ORDER — KETAMINE HCL-SODIUM CHLORIDE 100-0.9 MG/10ML-% IV SOSY
PREFILLED_SYRINGE | INTRAVENOUS | Status: DC | PRN
  Administered 2021-10-07 (×5): 10 mg via INTRAVENOUS

## 2021-10-07 MED ORDER — SODIUM CHLORIDE (PF) 0.9 % IJ SOLN
INTRAMUSCULAR | Status: DC | PRN
Start: 2021-10-07 — End: 2021-10-07
  Administered 2021-10-07: 20 mL

## 2021-10-07 MED ORDER — AMLODIPINE BESYLATE 10 MG PO TABS
10.0000 mg | ORAL_TABLET | Freq: Every day | ORAL | Status: DC
  Administered 2021-10-08 – 2021-10-09 (×2): 10 mg via ORAL
  Filled 2021-10-07 (×2): qty 1

## 2021-10-07 MED ORDER — PROMETHAZINE HCL 12.5 MG PO TABS: 12.5000 mg | ORAL_TABLET | ORAL | 0 refills | Status: AC | PRN

## 2021-10-07 MED ORDER — FAMOTIDINE 20 MG PO TABS
20.0000 mg | ORAL_TABLET | Freq: Two times a day (BID) | ORAL | Status: DC
  Administered 2021-10-07 – 2021-10-09 (×4): 20 mg via ORAL
  Filled 2021-10-07 (×4): qty 1

## 2021-10-07 MED ORDER — SUGAMMADEX SODIUM 200 MG/2ML IV SOLN
INTRAVENOUS | Status: DC | PRN
  Administered 2021-10-07: 150 mg via INTRAVENOUS

## 2021-10-07 MED ORDER — ROCURONIUM BROMIDE 10 MG/ML (PF) SYRINGE
PREFILLED_SYRINGE | INTRAVENOUS | Status: AC
  Filled 2021-10-07: qty 10

## 2021-10-07 MED ORDER — CHLORHEXIDINE GLUCONATE 0.12 % MT SOLN: 15.0000 mL | Freq: Once | OROMUCOSAL | Status: AC

## 2021-10-07 MED ORDER — ACETAMINOPHEN 10 MG/ML IV SOLN
INTRAVENOUS | Status: AC
  Filled 2021-10-07: qty 100

## 2021-10-07 MED ORDER — DEXAMETHASONE SODIUM PHOSPHATE 10 MG/ML IJ SOLN
INTRAMUSCULAR | Status: AC
  Filled 2021-10-07: qty 1

## 2021-10-07 MED ORDER — ROCURONIUM BROMIDE 10 MG/ML (PF) SYRINGE
PREFILLED_SYRINGE | INTRAVENOUS | Status: DC | PRN
  Administered 2021-10-07: 60 mg via INTRAVENOUS
  Administered 2021-10-07: 20 mg via INTRAVENOUS

## 2021-10-07 MED ORDER — ONDANSETRON HCL 4 MG/2ML IJ SOLN: 4.0000 mg | INTRAMUSCULAR | Status: DC | PRN

## 2021-10-07 MED ORDER — ONDANSETRON HCL 4 MG/2ML IJ SOLN
INTRAMUSCULAR | Status: AC
  Filled 2021-10-07: qty 2

## 2021-10-07 MED ORDER — DIPHENHYDRAMINE HCL 50 MG/ML IJ SOLN: 12.5000 mg | Freq: Four times a day (QID) | INTRAMUSCULAR | Status: DC | PRN

## 2021-10-07 MED ORDER — DEXTROSE-NACL 5-0.45 % IV SOLN
INTRAVENOUS | Status: DC
  Administered 2021-10-07: 1000 mL via INTRAVENOUS

## 2021-10-07 MED ORDER — ORAL CARE MOUTH RINSE
15.0000 mL | Freq: Once | OROMUCOSAL | Status: AC
  Administered 2021-10-07: 15 mL via OROMUCOSAL

## 2021-10-07 MED ORDER — HYOSCYAMINE SULFATE 0.125 MG SL SUBL
0.1250 mg | SUBLINGUAL_TABLET | SUBLINGUAL | Status: DC | PRN
  Filled 2021-10-07: qty 1

## 2021-10-07 MED ORDER — STERILE WATER FOR IRRIGATION IR SOLN
Status: DC | PRN
  Administered 2021-10-07: 1000 mL

## 2021-10-07 MED ORDER — HYDROCODONE-ACETAMINOPHEN 5-325 MG PO TABS
1.0000 | ORAL_TABLET | Freq: Four times a day (QID) | ORAL | 0 refills | Status: AC | PRN
Start: 2021-10-07 — End: ?

## 2021-10-07 MED ORDER — ACETAMINOPHEN 10 MG/ML IV SOLN
INTRAVENOUS | Status: DC | PRN
  Administered 2021-10-07: 1000 mg via INTRAVENOUS

## 2021-10-07 MED ORDER — CLINDAMYCIN PHOSPHATE 900 MG/50ML IV SOLN
900.0000 mg | INTRAVENOUS | Status: AC
  Administered 2021-10-07: 900 mg via INTRAVENOUS
  Filled 2021-10-07: qty 50

## 2021-10-07 MED ORDER — ONDANSETRON HCL 4 MG/2ML IJ SOLN: 4.0000 mg | Freq: Once | INTRAMUSCULAR | Status: DC | PRN

## 2021-10-07 MED ORDER — SODIUM CHLORIDE (PF) 0.9 % IJ SOLN
INTRAMUSCULAR | Status: AC
  Filled 2021-10-07: qty 20

## 2021-10-07 MED ORDER — FENTANYL CITRATE (PF) 100 MCG/2ML IJ SOLN
INTRAMUSCULAR | Status: AC
  Filled 2021-10-07: qty 2

## 2021-10-07 MED ORDER — ACETAMINOPHEN 10 MG/ML IV SOLN: 1000.0000 mg | Freq: Once | INTRAVENOUS | Status: DC | PRN

## 2021-10-07 MED ORDER — DEXAMETHASONE SODIUM PHOSPHATE 10 MG/ML IJ SOLN
INTRAMUSCULAR | Status: DC | PRN
  Administered 2021-10-07: 10 mg via INTRAVENOUS

## 2021-10-07 MED ORDER — KETAMINE HCL 50 MG/5ML IJ SOSY
PREFILLED_SYRINGE | INTRAMUSCULAR | Status: AC
  Filled 2021-10-07: qty 5

## 2021-10-07 MED ORDER — HYDROMORPHONE HCL 1 MG/ML IJ SOLN
0.2500 mg | INTRAMUSCULAR | Status: DC | PRN
  Administered 2021-10-07 (×4): 0.5 mg via INTRAVENOUS

## 2021-10-07 MED ORDER — LIDOCAINE 20MG/ML (2%) 15 ML SYRINGE OPTIME
INTRAMUSCULAR | Status: DC | PRN
  Administered 2021-10-07: 1.5 mg/kg/h via INTRAVENOUS

## 2021-10-07 SURGICAL SUPPLY — 57 items
ADH SKN CLS APL DERMABOND .7 (GAUZE/BANDAGES/DRESSINGS) ×1
APL PRP STRL LF DISP 70% ISPRP (MISCELLANEOUS) ×1
BAG COUNTER SPONGE SURGICOUNT (BAG) IMPLANT
BAG LAPAROSCOPIC 12 15 PORT 16 (BASKET) ×1 IMPLANT
BAG RETRIEVAL 12/15 (BASKET) ×2
BAG SPNG CNTER NS LX DISP (BAG)
CHLORAPREP W/TINT 26 (MISCELLANEOUS) ×2 IMPLANT
CLIP LIGATING HEM O LOK PURPLE (MISCELLANEOUS) ×2 IMPLANT
CLIP LIGATING HEMO LOK XL GOLD (MISCELLANEOUS) ×2 IMPLANT
CLIP LIGATING HEMO O LOK GREEN (MISCELLANEOUS) ×1 IMPLANT
COVER SURGICAL LIGHT HANDLE (MISCELLANEOUS) ×2 IMPLANT
COVER TIP SHEARS 8 DVNC (MISCELLANEOUS) ×1 IMPLANT
COVER TIP SHEARS 8MM DA VINCI (MISCELLANEOUS) ×2
CUTTER ECHEON FLEX ENDO 45 340 (ENDOMECHANICALS) ×1 IMPLANT
DERMABOND ADVANCED (GAUZE/BANDAGES/DRESSINGS) ×1
DERMABOND ADVANCED .7 DNX12 (GAUZE/BANDAGES/DRESSINGS) ×1 IMPLANT
DRAPE ARM DVNC X/XI (DISPOSABLE) ×4 IMPLANT
DRAPE COLUMN DVNC XI (DISPOSABLE) ×1 IMPLANT
DRAPE DA VINCI XI ARM (DISPOSABLE) ×8
DRAPE DA VINCI XI COLUMN (DISPOSABLE) ×2
DRAPE INCISE IOBAN 66X45 STRL (DRAPES) ×2 IMPLANT
DRAPE SHEET LG 3/4 BI-LAMINATE (DRAPES) ×2 IMPLANT
ELECT PENCIL ROCKER SW 15FT (MISCELLANEOUS) ×2 IMPLANT
ELECT REM PT RETURN 15FT ADLT (MISCELLANEOUS) ×2 IMPLANT
GAUZE 4X4 16PLY ~~LOC~~+RFID DBL (SPONGE) ×1 IMPLANT
GLOVE SURG ENC MOIS LTX SZ6.5 (GLOVE) ×2 IMPLANT
GLOVE SURG ENC TEXT LTX SZ7.5 (GLOVE) ×4 IMPLANT
GOWN STRL REUS W/TWL LRG LVL3 (GOWN DISPOSABLE) ×2 IMPLANT
GOWN STRL REUS W/TWL XL LVL3 (GOWN DISPOSABLE) ×4 IMPLANT
HOLDER FOLEY CATH W/STRAP (MISCELLANEOUS) ×2 IMPLANT
IRRIG SUCT STRYKERFLOW 2 WTIP (MISCELLANEOUS) ×2
IRRIGATION SUCT STRKRFLW 2 WTP (MISCELLANEOUS) ×1 IMPLANT
KIT BASIN OR (CUSTOM PROCEDURE TRAY) ×2 IMPLANT
KIT TURNOVER KIT A (KITS) IMPLANT
MARKER SKIN DUAL TIP RULER LAB (MISCELLANEOUS) ×2 IMPLANT
NDL INSUFFLATION 14GA 120MM (NEEDLE) ×1 IMPLANT
NEEDLE INSUFFLATION 14GA 120MM (NEEDLE) ×2 IMPLANT
PROTECTOR NERVE ULNAR (MISCELLANEOUS) ×4 IMPLANT
RELOAD STAPLE 45 2.6 WHT THIN (STAPLE) IMPLANT
SCISSORS LAP 5X45 EPIX DISP (ENDOMECHANICALS) ×1 IMPLANT
SEAL CANN UNIV 5-8 DVNC XI (MISCELLANEOUS) ×3 IMPLANT
SEAL XI 5MM-8MM UNIVERSAL (MISCELLANEOUS) ×6
SET TUBE SMOKE EVAC HIGH FLOW (TUBING) ×2 IMPLANT
SOLUTION ELECTROLUBE (MISCELLANEOUS) ×2 IMPLANT
SPIKE FLUID TRANSFER (MISCELLANEOUS) ×2 IMPLANT
STAPLE RELOAD 45 WHT (STAPLE) ×1 IMPLANT
STAPLE RELOAD 45MM WHITE (STAPLE) ×2
SUT MNCRL AB 4-0 PS2 18 (SUTURE) ×4 IMPLANT
SUT PDS AB 0 CT1 36 (SUTURE) ×4 IMPLANT
SUT VICRYL 0 UR6 27IN ABS (SUTURE) ×2 IMPLANT
TOWEL OR 17X26 10 PK STRL BLUE (TOWEL DISPOSABLE) ×2 IMPLANT
TOWEL OR NON WOVEN STRL DISP B (DISPOSABLE) ×2 IMPLANT
TRAY FOLEY MTR SLVR 16FR STAT (SET/KITS/TRAYS/PACK) ×2 IMPLANT
TRAY LAPAROSCOPIC (CUSTOM PROCEDURE TRAY) ×2 IMPLANT
TROCAR BLADELESS OPT 5 100 (ENDOMECHANICALS) IMPLANT
TROCAR XCEL 12X100 BLDLESS (ENDOMECHANICALS) ×2 IMPLANT
WATER STERILE IRR 1000ML POUR (IV SOLUTION) ×2 IMPLANT

## 2021-10-07 NOTE — Plan of Care (Signed)
?  Problem: Coping: ?Goal: Level of anxiety will decrease ?Outcome: Progressing ?  ?Problem: Pain Managment: ?Goal: General experience of comfort will improve ?Outcome: Progressing ?  ?Problem: Skin Integrity: ?Goal: Risk for impaired skin integrity will decrease ?Outcome: Progressing ?  ?Problem: Education: ?Goal: Knowledge of the prescribed therapeutic regimen will improve ?Outcome: Progressing ?  ?Problem: Respiratory: ?Goal: Ability to achieve and maintain a regular respiratory rate will improve ?Outcome: Progressing ?  ?

## 2021-10-07 NOTE — Anesthesia Procedure Notes (Signed)
Procedure Name: Intubation ?Date/Time: 10/07/2021 7:29 AM ?Performed by: Kiegan Macaraeg D, CRNA ?Pre-anesthesia Checklist: Patient identified, Emergency Drugs available, Suction available and Patient being monitored ?Patient Re-evaluated:Patient Re-evaluated prior to induction ?Oxygen Delivery Method: Circle system utilized ?Preoxygenation: Pre-oxygenation with 100% oxygen ?Induction Type: IV induction ?Ventilation: Mask ventilation without difficulty ?Grade View: Grade I ?Tube type: Oral ?Tube size: 7.0 mm ?Number of attempts: 1 ?Airway Equipment and Method: Stylet ?Placement Confirmation: ETT inserted through vocal cords under direct vision, positive ETCO2 and breath sounds checked- equal and bilateral ?Secured at: 21 cm ?Tube secured with: Tape ?Dental Injury: Teeth and Oropharynx as per pre-operative assessment  ? ? ? ? ?

## 2021-10-07 NOTE — Transfer of Care (Signed)
Immediate Anesthesia Transfer of Care Note ? ?Patient: Beth Pineda ? ?Procedure(s) Performed: XI ROBOTIC ASSISTED LAPAROSCOPIC NEPHRECTOMY (Right: Flank) ? ?Patient Location: PACU ? ?Anesthesia Type:General ? ?Level of Consciousness: awake, alert  and oriented ? ?Airway & Oxygen Therapy: Patient Spontanous Breathing and Patient connected to face mask oxygen ? ?Post-op Assessment: Report given to RN ? ?Post vital signs: Reviewed and stable ? ?Last Vitals:  ?Vitals Value Taken Time  ?BP 138/85 10/07/21 0949  ?Temp    ?Pulse 78 10/07/21 0956  ?Resp 25 10/07/21 0956  ?SpO2 100 % 10/07/21 0956  ?Vitals shown include unvalidated device data. ? ?Last Pain:  ?Vitals:  ? 10/07/21 0541  ?TempSrc: Oral  ?   ? ?  ? ?Complications: No notable events documented. ?

## 2021-10-07 NOTE — Discharge Instructions (Signed)

## 2021-10-07 NOTE — Op Note (Signed)
Operative Note ? ?Preoperative diagnosis:  ?1.  10 cm cystic right renal mass ? ?Postoperative diagnosis: ?1.  Same ? ?Procedure(s): ?1.  Robot-assisted laparoscopic right radical nephrectomy (adrenal sparing) ? ?Surgeon: Ellison Hughs, MD ? ?Assistants: Debbrah Alar, PA-C  An assistant was required for this surgical procedure.  The duties of the assistant included but were not limited to suctioning, passing suture, camera manipulation, retraction.  This procedure would not be able to be performed without an Environmental consultant.  ? ?Anesthesia:  General ? ?Complications:  None ? ?EBL: 50 mL ? ?Specimens: ?1.  Right kidney ? ?Drains/Catheters: ?1.  Foley catheter ? ?Intraoperative findings:   ?Large cystic mass involving the right kidney ?The right renal hilum was hemostatic following staple ligation ? ?Indication:  Beth Pineda is a 71 y.o. female with a 10 cm complex cystic mass involving the right kidney with features concerning for malignancy.  She has been consented for the above procedures, voices understanding and wishes to proceed. ? ?Description of procedure: ? ?After informed consent was signed, the patient was taken back to the operating room and properly anesthetized.  The patient was then placed in the left lateral decubitus position with all pressure points padded.  The abdomen was then prepped and draped in the usual sterile fashion.  A time-out was then performed.   ?  ?An 8 mm incision was then made lateral to the right rectus muscle at the level of the right 12th rib.  A Veress needle was then used to access the abdominal cavity.  A saline drop test showed no signs of obstruction and aspiration of the Veress needle revealed no blood or sucus.  The abdominal cavity was then insufflated to 15 mmHg.  An 8 mm robotic trocar was then atraumatically inserted into the abdominal cavity.  The robotic camera was then inserted through the port and inspection of the abdominal cavity revealed no evidence of  adjacent organ or vessel injury. We then placed three additional 8 mm robotic ports and a 12 mm assistant port in such a fashion to triangulate the right renal hilum.  The robot was then docked into postion. ?  ?Using a combination of blunt and cold scissors dissection, the hepatic attachments were released from the abdominal sidewall.  A locking grasper was then inserted through the 5 mm sub-xyphoid port and used to retract the posterior surface of the liver more cephalad.  The white line of Toldt along the ascending colon was then incised, allowing [place] to reflect the colon medially and expose the anterior surface of the right kidney.  The duodenum was then Kocherized medially, which abruptly led [place] to the identification of the inferior vena cava. ?   ?Once the colon was adequately mobilized, we moved to the lower pole and identified the gonadal vein and ureter.  The gonadal vein was then left running parallel to the vena cava and the right ureter was reflected anteriorly.  Using cautious cautery, the overlying perihilar attachments were then released.  This yielded visualization of the renal hilum, which included a single right renal vein and a single right renal artery.  The perilymphatic tissue surrounding the right renal artery were carefully released so that the right renal artery was fully encircled. ?   ?A 45 mm powered endovascular stapler was then used to ligate the right renal artery and vein, en bloc.  The right hilar stump was hemostatic following staple ligation.  Hemoclips were then applied to the proximal aspects of the right  ureter, which was then sharply incised.  The remaining perinephric attachments were then incised using electrocautery. Reinspection of the right retroperitoneal space revealed excellent hemostasis. Once the right kidney was fully mobile, it was placed in an Endo Catch bag and left in the abdominal cavity. ?  ?The 12 mm midline assistant port was then closed using the [employer] technique with a 0 Vicryl suture.  A right lower quadrant Gibson incision was then made in the right kidney was removed within the Endo Catch bag.  The fascia of the external and internal oblique were then closed with a running 0 PDS suture.  The skin incisions were then closed using 4-0 Monocryl.  Dermabond was applied to all skin incisions. ? ? ?Plan:  Monitor on the floor overnight.  ? ?

## 2021-10-07 NOTE — Anesthesia Postprocedure Evaluation (Signed)
Anesthesia Post Note ? ?Patient: Beth Pineda ? ?Procedure(s) Performed: XI ROBOTIC ASSISTED LAPAROSCOPIC NEPHRECTOMY (Right: Flank) ? ?  ? ?Patient location during evaluation: PACU ?Anesthesia Type: General ?Level of consciousness: awake and alert ?Pain management: pain level controlled ?Vital Signs Assessment: post-procedure vital signs reviewed and stable ?Respiratory status: spontaneous breathing, nonlabored ventilation, respiratory function stable and patient connected to nasal cannula oxygen ?Cardiovascular status: blood pressure returned to baseline and stable ?Postop Assessment: no apparent nausea or vomiting ?Anesthetic complications: no ? ? ?No notable events documented. ? ?Last Vitals:  ?Vitals:  ? 10/07/21 1100 10/07/21 1127  ?BP: 131/82 (!) 143/79  ?Pulse: 81 83  ?Resp: 13 20  ?Temp: (!) 36.4 ?C 36.4 ?C  ?SpO2: 96% 96%  ?  ?Last Pain:  ?Vitals:  ? 10/07/21 1127  ?TempSrc: Oral  ?PainSc:   ? ? ?  ?  ?  ?  ?  ?  ? ?[place] ? ? ? ? ?

## 2021-10-08 ENCOUNTER — Encounter (HOSPITAL_COMMUNITY): Payer: Self-pay | Admitting: Urology

## 2021-10-08 LAB — BASIC METABOLIC PANEL
Anion gap: 8 (ref 5–15)
BUN: 8 mg/dL (ref 8–23)
CO2: 22 mmol/L (ref 22–32)
Calcium: 8.9 mg/dL (ref 8.9–10.3)
Chloride: 106 mmol/L (ref 98–111)
Creatinine, Ser: 0.91 mg/dL (ref 0.44–1.00)
GFR, Estimated: >60 mL/min (ref >60)
Glucose, Bld: 127 mg/dL — ABNORMAL HIGH (ref 70–99)
Potassium: 4.2 mmol/L (ref 3.5–5.1)
Sodium: 136 mmol/L (ref 135–145)

## 2021-10-08 LAB — HEMOGLOBIN AND HEMATOCRIT, BLOOD
HCT: 37.1 % (ref 36.0–46.0)
Hemoglobin: 11.8 g/dL — ABNORMAL LOW (ref 12.0–15.0)

## 2021-10-08 MED ORDER — BISACODYL 10 MG RE SUPP
10.0000 mg | Freq: Once | RECTAL | Status: AC
  Administered 2021-10-08: 10 mg via RECTAL
  Filled 2021-10-08: qty 1

## 2021-10-08 NOTE — Progress Notes (Signed)
Assisted patient to sit on side of bed. Patient tolerated position change fair. Dangled for a few minutes. With 1 person assisting and walker, patient was able to stand after 2 attempts. Patient was unable to ambulate in hall. Patient took a few steps. Patient was unable to complete. Education provided regarding the value of early ambulation and will plan to ambulate in morning. Will make oncoming nurse aware and will cont to monitor. ?

## 2021-10-08 NOTE — Progress Notes (Signed)
Foley catheter removed per order. Nasal cannula removed. Diet advanced to soft.  ?

## 2021-10-08 NOTE — TOC Initial Note (Signed)
Transition of Care (TOC) - Initial/Assessment Note  ? ? ?Patient Details  ?Name: Beth Pineda ?MRN: [id number] ?Date of Birth: 04-02-1951 ? ?Transition of Care (TOC) CM/SW Contact:    ?Tawanna Cooler, RN ?Phone Number: ?10/08/2021, 12:21 PM ? ?Clinical Narrative:                 ? ?Transition of Care Department [facility]) has reviewed patient and no TOC needs have been identified at this time. We will continue to monitor patient advancement through interdisciplinary progression rounds. If new patient transition needs arise, please place a TOC consult. ?  ? ?Expected Discharge Plan: Home/Self Care ?Barriers to Discharge: Continued Medical Work up ? ? ?Patient Goals and CMS Choice ?Patient states their goals for this hospitalization and ongoing recovery are:: return home ?  ?  ? ?Expected Discharge Plan and Services ?Expected Discharge Plan: Home/Self Care ?  ?  ?  ?Living arrangements for the past 2 months: [name] ?                ?  ?Prior Living Arrangements/Services ?Living arrangements for the past 2 months: [name] ?Lives with:: Relatives ?Patient language and need for interpreter reviewed:: Yes ?Do you feel safe going back to the place where you live?: Yes      ?Need for Family Participation in Patient Care: Yes (Comment) ?Care giver support system in place?: Yes (comment) ?  ?Criminal Activity/Legal Involvement Pertinent to Current Situation/Hospitalization: No - Comment as needed ? ?Activities of Daily Living ?Home Assistive Devices/Equipment: None ?ADL Screening (condition at time of admission) ?Patient's cognitive ability adequate to safely complete daily activities?: Yes ?Is the patient deaf or have difficulty hearing?: No ?Does the patient have difficulty seeing, even when wearing glasses/contacts?: No ?Does the patient have difficulty concentrating, remembering, or making decisions?: No ?Patient able to express need for assistance with ADLs?: Yes ?Does the patient have difficulty  dressing or bathing?: No ?Independently performs ADLs?: Yes (appropriate for developmental age) ?Does the patient have difficulty walking or climbing stairs?: No ?Weakness of Legs: None ?Weakness of Arms/Hands: None ? ?  ?Orientation: : Oriented to Self, Oriented to Place, Oriented to  Time, Oriented to Situation ?Alcohol / Substance Use: Not Applicable ?Psych Involvement: No (comment) ? ?Admission diagnosis:  Renal mass [N28.89] ?Patient Active Problem List  ? Diagnosis Date Noted  ? Renal mass 10/07/2021  ? Chest pain on breathing   ? PVC's (premature ventricular contractions) 05/14/2015  ? Pain in the chest   ? Essential hypertension   ? Tobacco abuse   ? Chest pain 05/12/2015  ? Sepsis secondary to UTI ([place]) 09/19/2014  ? Pyelonephritis, acute 09/19/2014  ? ?PCP:  Dulce Sellar, MD ?Pharmacy:   ?[facility] [address] ?[address] [place] ?[place] [address] ?Phone: [phone] Fax: [phone] ? ?\ ?Readmission Risk Interventions ?   ? View : No data to display.  ?  ?  ?  ? ? ? ?

## 2021-10-08 NOTE — Progress Notes (Signed)
1 Day Post-Op ?Subjective: ?Patient reports soreness in flank. Denies N/V, flatus, and BM.  Has not ambulated.  Using IS. Labs and vitals stable.  Excellent UO. Still with O2 via [place] (sats have stayed >95%). ? ?Objective: ?Vital signs in last 24 hours: ?Temp:  [96.5 ?F (35.8 ?C)-98.7 ?F (37.1 ?C)] 98.2 ?F (36.8 ?C) (03/22 0234) ?Pulse Rate:  [74-102] 90 (03/22 0234) ?Resp:  [9-24] 18 (03/22 0234) ?BP: (127-153)/(68-93) 147/84 (03/22 0234) ?SpO2:  [95 %-100 %] 96 % (03/22 0234) ?Weight:  [70.8 kg] 70.8 kg (03/21 1224) ? ?Intake/Output from previous day: ?03/21 0701 - 03/22 0700 ?In: 3412.8 [P.O.:480; I.V.:2932.8] ?Out: 6333 [LKTGY:5638; Blood:60] ?Intake/Output this shift: ?No intake/output data recorded. ? ?Physical Exam:  ?General:alert, cooperative, and no distress ?Cardiovascular: RRR ?Lungs: CTA ?GI: soft; some BS; appropriately tender, ND ?Incisions: C/D/I; ecchymosis surrounding gibson ?Urine: Clear ?Extremities: SCDs in place ? ?Lab Results: ?Recent Labs  ?  10/07/21 ?1019 10/08/21 ?0444  ?HGB 13.2 11.8*  ?HCT 42.0 37.1  ? ?BMET ?Recent Labs  ?  10/08/21 ?0444  ?NA 136  ?K 4.2  ?CL 106  ?CO2 22  ?GLUCOSE 127*  ?BUN 8  ?CREATININE 0.91  ?CALCIUM 8.9  ? ?No results for input(s): LABPT, INR in the last 72 hours. ?No results for input(s): LABURIN in the last 72 hours. ? ? ?Studies/Results: ?No results found. ? ?Assessment/Plan: ?1 Day Post-Op, Procedure(s) (LRB): ?XI ROBOTIC ASSISTED LAPAROSCOPIC NEPHRECTOMY (Right) ? ?Doing well POD 1 ? ?Ambulate, Incentive spirometry ?DVT prophylaxis ?Transition to PO pain medications ?SL IVF ?Advance diet to softs ?D/c foley with TOV ?Wean O2 off ?Poss d/c home later today vs tomorrow ? ? LOS: 1 day  ? ?Debbrah Alar ?10/08/2021, 7:36 AM ? ? ? ? ?

## 2021-10-09 NOTE — Progress Notes (Signed)
D/C instructions reviewed w/ pt and son. Both verbalize understanding and all questions answered. Pt d/c in w/c in stable condition to son's car. Pt in possession of d/c packet and all personal belongings.  ?

## 2021-10-09 NOTE — Discharge Summary (Signed)
?Date of admission: 10/07/2021 ? ?Date of discharge: 10/09/2021 ? ?Admission diagnosis: Right renal mass ? ?Discharge diagnosis: same ? ?Secondary diagnoses: anxiety, anemia, depression, arrhythmia, HLD, HTN ? ?History and Physical: For full details, please see admission history and physical. Briefly, Beth Pineda is a 71 y.o. year old patient with a multilocular right renal mass.  CT report from 01/2021 noted a 10 cm complex right peripelvic cystic lesion (the mass measured 8.3 x 6.1 cm on CT from 2010) and a normal left kidney. She denies interval episodes of flank pain, UTI, dysuria, hematuria, nausea or early satiety.  ? ?Hospital Course: Pt was admitted and taken to the OR on 10/07/21 for a robotic assisted lap right radical nephrectomy.  Pt tolerated the procedure well and was hemodynamically stable throughout.  She was extubated without complication and woke up from anesthesia neurologically intact. She was transferred from the OR to PACU and then to the floor without difficulty.  Post op course progressed as expected.  Vitals and labs remained stable. On POD 1 foley was removed and she was able to void. By POD 2 she was ambulating without difficulty and bowel function had returned.  She was tolerating a regular diet and pain was well controlled.  Pt had met all criteria and was felt stable for d/c home. ? ?Laboratory values:  ?Recent Labs  ?  10/07/21 ?1019 10/08/21 ?0444  ?HGB 13.2 11.8*  ?HCT 42.0 37.1  ?. ?Lab Results  ?Component Value Date  ? CREATININE 0.91 10/08/2021  ?  ? ?Disposition: Home ? ?Discharge instruction: The patient was instructed to be ambulatory but to refrain from heavy lifting, strenuous activity, or driving.  ? ?Discharge medications:  . ?Allergies as of 10/09/2021   ? ?   Reactions  ? Lisinopril Shortness Of Breath, Nausea And Vomiting  ? Made patient feel real bad  ? Ciprofloxacin Other (See Comments)  ? Shoulder locked up  ? Nitrofurantoin Other (See Comments)  ? Other Reaction:  Other reaction  ? Clonidine Derivatives Rash  ? ?  ? ?  ?Medication List  ?  ? ?TAKE these medications   ? ?amLODipine 10 MG tablet ?Commonly known as: NORVASC ?Take 1 tablet (10 mg total) by mouth daily. ?  ?docusate sodium 100 MG capsule ?Commonly known as: COLACE ?Take 1 capsule (100 mg total) by mouth 2 (two) times daily. ?  ?famotidine 20 MG tablet ?Commonly known as: PEPCID ?Take 20 mg by mouth 2 (two) times daily. ?  ?HYDROcodone-acetaminophen 5-325 MG tablet ?Commonly known as: Norco ?Take 1-2 tablets by mouth every 6 (six) hours as needed for moderate pain. ?  ?lidocaine 2 % solution ?Commonly known as: XYLOCAINE ?Use as directed 15 mLs in the mouth or throat as needed (for abdominal pain). ?  ?omeprazole 20 MG capsule ?Commonly known as: PRILOSEC ?Take 1 capsule (20 mg total) by mouth daily. ?  ?promethazine 12.5 MG tablet ?Commonly known as: PHENERGAN ?Take 1 tablet (12.5 mg total) by mouth every 4 (four) hours as needed for nausea or vomiting. ?  ?sucralfate 1 GM/10ML suspension ?Commonly known as: Carafate ?Take 10 mLs (1 g total) by mouth 4 (four) times daily -  with meals and at bedtime. ?  ? ?  ?  ? ?Followup:  ? Follow-up Information   ? ? Ceasar Mons, MD Follow up on 10/20/2021.   ?Specialty: Urology ?Why: at 9:15 ?Contact information: ?[address] ?[address] ?[place] [address] ?[phone] ? ? ?  ?  ? ?  ?  ? ?  ?  ?

## 2021-10-09 NOTE — Progress Notes (Signed)
2 Days Post-Op ?Subjective: ?Patient reports feeling better today.  She ambulated several times yesterday.  Using IS.  Good UO. Tolerating soft diet.  Passing flatus and had BM yesterday.  ? ?Objective: ?Vital signs in last 24 hours: ?Temp:  [97.9 ?F (36.6 ?C)-98.2 ?F (36.8 ?C)] 98 ?F (36.7 ?C) (03/23 0526) ?Pulse Rate:  [70-100] 93 (03/23 0526) ?Resp:  [15-20] 15 (03/23 0526) ?BP: (141-161)/(80-91) 144/80 (03/23 0526) ?SpO2:  [93 %-95 %] 93 % (03/23 0526) ? ?Intake/Output from previous day: ?03/22 0701 - 03/23 0700 ?In: 720 [P.O.:720] ?Out: 1900 [Urine:1900] ?Intake/Output this shift: ?No intake/output data recorded. ? ?Physical Exam:  ?General:alert, cooperative, and no distress ?Cardiovascular: RRR ?Lungs: CTA ?GI: soft; NT; ND; +BS ?Incisions: erythema/bruising surrounding gibson; all others C/D/I ? ?Lab Results: ?Recent Labs  ?  10/07/21 ?1019 10/08/21 ?0444  ?HGB 13.2 11.8*  ?HCT 42.0 37.1  ? ?BMET ?Recent Labs  ?  10/08/21 ?0444  ?NA 136  ?K 4.2  ?CL 106  ?CO2 22  ?GLUCOSE 127*  ?BUN 8  ?CREATININE 0.91  ?CALCIUM 8.9  ? ?No results for input(s): LABPT, INR in the last 72 hours. ?No results for input(s): LABURIN in the last 72 hours. ? ? ?Studies/Results: ?No results found. ? ?Assessment/Plan: ?2 Days Post-Op, Procedure(s) (LRB): ?XI ROBOTIC ASSISTED LAPAROSCOPIC NEPHRECTOMY (Right) ? ?Doing well ?Ambulate, Incentive spirometry ?DVT prophylaxis ?Advance diet ?D/C home ? ? LOS: 2 days  ? ?Debbrah Alar ?10/09/2021, 7:29 AM ? ? ? ? ?

## 2021-10-09 NOTE — Plan of Care (Signed)
  Problem: Health Behavior/Discharge Planning: Goal: Ability to manage health-related needs will improve Outcome: Progressing   Problem: Clinical Measurements: Goal: Ability to maintain clinical measurements within normal limits will improve Outcome: Progressing   Problem: Activity: Goal: Risk for activity intolerance will decrease Outcome: Progressing   

## 2021-10-09 NOTE — Plan of Care (Signed)
?  Problem: Education: ?Goal: Knowledge of General Education information will improve ?Description: Including pain rating scale, medication(s)/side effects and non-pharmacologic comfort measures ?Outcome: Completed/Met ?  ?Problem: Health Behavior/Discharge Planning: ?Goal: Ability to manage health-related needs will improve ?Outcome: Completed/Met ?  ?Problem: Clinical Measurements: ?Goal: Ability to maintain clinical measurements within normal limits will improve ?Outcome: Completed/Met ?Goal: Will remain free from infection ?Outcome: Completed/Met ?Goal: Diagnostic test results will improve ?Outcome: Completed/Met ?Goal: Respiratory complications will improve ?Outcome: Completed/Met ?Goal: Cardiovascular complication will be avoided ?Outcome: Completed/Met ?  ?Problem: Activity: ?Goal: Risk for activity intolerance will decrease ?Outcome: Completed/Met ?  ?Problem: Nutrition: ?Goal: Adequate nutrition will be maintained ?Outcome: Completed/Met ?  ?Problem: Coping: ?Goal: Level of anxiety will decrease ?Outcome: Completed/Met ?  ?Problem: Elimination: ?Goal: Will not experience complications related to bowel motility ?Outcome: Completed/Met ?Goal: Will not experience complications related to urinary retention ?Outcome: Completed/Met ?  ?Problem: Pain Managment: ?Goal: General experience of comfort will improve ?Outcome: Completed/Met ?  ?Problem: Safety: ?Goal: Ability to remain free from injury will improve ?Outcome: Completed/Met ?  ?Problem: Skin Integrity: ?Goal: Risk for impaired skin integrity will decrease ?Outcome: Completed/Met ?  ?Problem: Education: ?Goal: Knowledge of the prescribed therapeutic regimen will improve ?Outcome: Completed/Met ?  ?Problem: Respiratory: ?Goal: Ability to achieve and maintain a regular respiratory rate will improve ?Outcome: Completed/Met ?  ?

## 2021-10-10 LAB — SURGICAL PATHOLOGY

## 2024-08-08 ENCOUNTER — Other Ambulatory Visit: Payer: Self-pay | Admitting: Adult Medicine

## 2024-08-08 DIAGNOSIS — Z1231 Encounter for screening mammogram for malignant neoplasm of breast: Secondary | ICD-10-CM
